# Patient Record
Sex: Female | Born: 1965 | Race: Asian | Hispanic: No | Marital: Married | Smoking: Never smoker
Health system: Southern US, Community
[De-identification: ages and names within clinical notes are randomized; demographics above are authoritative.]

## PROBLEM LIST (undated history)

## (undated) DIAGNOSIS — K635 Polyp of colon: Secondary | ICD-10-CM

## (undated) DIAGNOSIS — T7840XA Allergy, unspecified, initial encounter: Secondary | ICD-10-CM

## (undated) DIAGNOSIS — E559 Vitamin D deficiency, unspecified: Secondary | ICD-10-CM

## (undated) DIAGNOSIS — K219 Gastro-esophageal reflux disease without esophagitis: Secondary | ICD-10-CM

## (undated) HISTORY — DX: Allergy, unspecified, initial encounter: T78.40XA

## (undated) HISTORY — DX: Vitamin D deficiency, unspecified: E55.9

## (undated) HISTORY — DX: Polyp of colon: K63.5

## (undated) HISTORY — DX: Gastro-esophageal reflux disease without esophagitis: K21.9

## (undated) HISTORY — PX: EYE SURGERY: SHX253

## (undated) HISTORY — PX: HEMORRHOID BANDING: SHX5850

---

## 2011-03-01 ENCOUNTER — Other Ambulatory Visit: Payer: Self-pay | Admitting: Neurology

## 2011-03-01 DIAGNOSIS — R200 Anesthesia of skin: Secondary | ICD-10-CM

## 2011-03-08 ENCOUNTER — Other Ambulatory Visit (HOSPITAL_COMMUNITY): Payer: Self-pay

## 2011-10-27 ENCOUNTER — Ambulatory Visit (INDEPENDENT_AMBULATORY_CARE_PROVIDER_SITE_OTHER): Payer: BC Managed Care – PPO | Admitting: Family Medicine

## 2011-10-27 VITALS — BP 98/68 | HR 67 | Temp 98.0°F | Resp 16 | Ht 61.5 in | Wt 107.0 lb

## 2011-10-27 DIAGNOSIS — R591 Generalized enlarged lymph nodes: Secondary | ICD-10-CM

## 2011-10-27 DIAGNOSIS — K219 Gastro-esophageal reflux disease without esophagitis: Secondary | ICD-10-CM

## 2011-10-27 DIAGNOSIS — R599 Enlarged lymph nodes, unspecified: Secondary | ICD-10-CM

## 2011-10-27 MED ORDER — DOXYCYCLINE HYCLATE 100 MG PO TABS: 100.0000 mg | ORAL_TABLET | Freq: Two times a day (BID) | ORAL | Status: AC

## 2011-10-27 MED ORDER — OMEPRAZOLE 40 MG PO CPDR: 40.0000 mg | DELAYED_RELEASE_CAPSULE | Freq: Every day | ORAL | Status: DC

## 2011-10-27 NOTE — Progress Notes (Signed)
46 yo with swollen right axillary area.  Also has long h/o reflux with past endoscopy showing inflammation.  She still has esophageal burning  Objective:  NAD Mild fleshy swelling right axilla.  No breast nodules, no cervical or supraclav adenopathy. Normal abdominal exam  Assessment:  GERD and inflammatory axillary node  Plan: 1. Adenopathy  doxycycline (VIBRA-TABS) 100 MG tablet  2. GERD (gastroesophageal reflux disease)  omeprazole (PRILOSEC) 40 MG capsule   Follow up 2 weeks

## 2011-11-03 ENCOUNTER — Ambulatory Visit: Payer: Self-pay | Admitting: Family

## 2011-11-10 ENCOUNTER — Encounter: Payer: Self-pay | Admitting: Family Medicine

## 2011-11-10 ENCOUNTER — Ambulatory Visit (INDEPENDENT_AMBULATORY_CARE_PROVIDER_SITE_OTHER): Payer: BC Managed Care – PPO | Admitting: Family Medicine

## 2011-11-10 VITALS — BP 122/72 | HR 72 | Temp 97.6°F | Resp 16 | Ht 61.5 in | Wt 106.0 lb

## 2011-11-10 DIAGNOSIS — R599 Enlarged lymph nodes, unspecified: Secondary | ICD-10-CM

## 2011-11-10 DIAGNOSIS — R590 Localized enlarged lymph nodes: Secondary | ICD-10-CM

## 2011-11-10 DIAGNOSIS — N6009 Solitary cyst of unspecified breast: Secondary | ICD-10-CM

## 2011-11-10 NOTE — Progress Notes (Signed)
46 yo with persistent right axillary adenopathy.  She took the doxycycline and thinks the gland is still swollen. Also, she has noted a left medial breast cyst.  This she has had in the past and a scan showed only benign findings.  Nevertheless she is concerned with the increase size of the lump now.  Objective:  Right axilla shows mild swelling which I think is just an intradermal cyst The left breast has a 1 cm nontender subcutaneous or breast nodule  Assessment:  I am referring patient for mammogram and surgical consultation to see if further biopsy is warranted: 1. Breast cyst  MM Digital Diagnostic Bilat  2. Axillary adenopathy  Ambulatory referral to General Surgery

## 2011-11-25 ENCOUNTER — Encounter (INDEPENDENT_AMBULATORY_CARE_PROVIDER_SITE_OTHER): Payer: Self-pay | Admitting: General Surgery

## 2011-11-25 ENCOUNTER — Ambulatory Visit (INDEPENDENT_AMBULATORY_CARE_PROVIDER_SITE_OTHER): Payer: BC Managed Care – PPO | Admitting: General Surgery

## 2011-11-25 ENCOUNTER — Telehealth (INDEPENDENT_AMBULATORY_CARE_PROVIDER_SITE_OTHER): Payer: Self-pay

## 2011-11-25 VITALS — BP 102/58 | HR 68 | Temp 98.0°F | Resp 16 | Ht 61.0 in | Wt 109.2 lb

## 2011-11-25 DIAGNOSIS — Q838 Other congenital malformations of breast: Secondary | ICD-10-CM

## 2011-11-25 NOTE — Telephone Encounter (Signed)
Fax medical record request to [facility] @ [phone] re: MGM, [place]

## 2011-11-25 NOTE — Progress Notes (Signed)
Subjective:   lump under her right arm  Patient ID: Diana Taylor, female   DOB: [birth year], 46 y.o.   MRN: [id number]  HPI Patient is a 46 year old female referred by Dr. Milus Glazier for a possible right axillary mass or adenopathy. The patient states that she noted an area of swelling under her right arm about 2 months ago. It was initially tender. It has remained the same size but now is not at all tender. She has no other areas of swelling in her neck or axilla or groins. No night sweats or fevers or other constitutional symptoms. She otherwise feels well.  She has had mammogram and ultrasound performed at [facility] and we have a verbal report of negative results but we'll confirm with reports.  History reviewed. No pertinent past medical history. History reviewed. No pertinent past surgical history. Current Outpatient Prescriptions  Medication Sig Dispense Refill  . omeprazole (PRILOSEC) 40 MG capsule Take 1 capsule (40 mg total) by mouth daily.  30 capsule  3   No Known Allergies   Review of Systems  Constitutional: Negative for fever, chills, diaphoresis, fatigue and unexpected weight change.  Respiratory: Negative for cough and shortness of breath.        Objective:   Physical Exam BP 102/58  Pulse 68  Temp 98 F (36.7 C) (Temporal)  Resp 16  Ht 5\' 1"  (1.549 m)  Wt 109 lb 3.2 oz (49.533 kg)  BMI 20.63 kg/m2  LMP 11/03/2011 General: Thin Asian female in no distress Skin: No rash or infection HEENT: No palpable masses or thyromegaly Breasts: No palpable abnormalities in either breast. No skin changes or nipple inversion. Lymph nodes/extremities: I cannot feel any enlarged lymph nodes in her neck, either axilla or groins. Peri-in the right axilla, actually toward the right side of the axilla is an approximately 2-1/2 cm soft smooth rounded area of soft tissue swelling consistent with ectopic breast tissue. 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 I do not feel adenopathy or anything that is worrisome for neoplasm.    Plan:     I discussed the diagnosis with the patient and she is reassured. I will review her breast imaging when we obtain this. If this area remains asymptomatic and does not enlarge I would not recommend any surgical treatment. She is given instructions to return for a repeat examination if the area again become symptomatic or enlarges or worries were in any way.

## 2011-11-25 NOTE — Patient Instructions (Signed)
This appears to be a small area of "ectopic" or extra breast tissue under your arm. It is not dangerous and does not need to be removed.  If this area enlarges or there are other changes that concern you call for a repeat examination

## 2011-12-07 ENCOUNTER — Encounter: Payer: Self-pay | Admitting: Family Medicine

## 2012-03-17 ENCOUNTER — Ambulatory Visit (INDEPENDENT_AMBULATORY_CARE_PROVIDER_SITE_OTHER): Payer: BC Managed Care – PPO | Admitting: Family Medicine

## 2012-03-17 VITALS — BP 115/74 | HR 74 | Temp 98.1°F | Resp 16 | Ht 61.0 in | Wt 110.0 lb

## 2012-03-17 DIAGNOSIS — R599 Enlarged lymph nodes, unspecified: Secondary | ICD-10-CM

## 2012-03-17 DIAGNOSIS — E041 Nontoxic single thyroid nodule: Secondary | ICD-10-CM

## 2012-03-17 DIAGNOSIS — E0789 Other specified disorders of thyroid: Secondary | ICD-10-CM

## 2012-03-17 DIAGNOSIS — R591 Generalized enlarged lymph nodes: Secondary | ICD-10-CM

## 2012-03-17 DIAGNOSIS — Z0000 Encounter for general adult medical examination without abnormal findings: Secondary | ICD-10-CM

## 2012-03-17 LAB — POCT CBC
Granulocyte percent: 59.8 %G (ref 37–80)
HCT, POC: 41.9 % (ref 37.7–47.9)
Hemoglobin: 13.1 g/dL (ref 12.2–16.2)
Lymph, poc: 1.9 (ref 0.6–3.4)
MCH, POC: 29.7 pg (ref 27–31.2)
MCHC: 31.3 g/dL — AB (ref 31.8–35.4)
MCV: 95.1 fL (ref 80–97)
MID (cbc): 0.5 (ref 0–0.9)
MPV: 9.2 fL (ref 0–99.8)
POC Granulocyte: 3.6 (ref 2–6.9)
POC LYMPH PERCENT: 31.4 %L (ref 10–50)
POC MID %: 8.8 % (ref 0–12)
Platelet Count, POC: 246 10*3/uL (ref 142–424)
RBC: 4.41 M/uL (ref 4.04–5.48)
RDW, POC: 13 %
WBC: 6 10*3/uL (ref 4.6–10.2)

## 2012-03-17 LAB — IFOBT (OCCULT BLOOD): IFOBT: POSITIVE

## 2012-03-17 NOTE — Progress Notes (Signed)
 [facility]:  Office Visit  Chief Complaint:  Chief Complaint  Patient presents with  . Annual Exam    HPI: Diana Taylor is a 47 y.o. female who complains of Annual exam: Always feels full. She had an EGD done in [facility] 2010 and was told she had some esophagus issue sounds like GERD.  LMP- 03/13/2012 Last pap smear-? Last mammogram-last done in October 2013. Normal.   Has lumps on neck, was having lumps on right axilla and was seen by Dr. Milus Glazier and was told that there was nothing to worry about after seen by general surgery--see note in EPIC.  Denies any immunocompromise, denies TB, HIV, unintentional weight loss.   History reviewed. No pertinent past medical history. History reviewed. No pertinent past surgical history. History   Social History  . Marital Status: Married    Spouse Name: N/A    Number of Children: N/A  . Years of Education: N/A   Social History Main Topics  . Smoking status: Never Smoker   . Smokeless tobacco: None  . Alcohol Use: None  . Drug Use: None  . Sexually Active: None   Other Topics Concern  . None   Social History Narrative  . None   Family History  Problem Relation Age of Onset  . Stroke Mother   . COPD Father    No Known Allergies Prior to Admission medications   Medication Sig Start Date End Date Taking? Authorizing Provider  omeprazole (PRILOSEC) 40 MG capsule Take 1 capsule (40 mg total) by mouth daily. 10/27/11 10/26/12  Elvina Sidle, MD     ROS: The patient denies fevers, chills, night sweats, unintentional weight loss, chest pain, palpitations, wheezing, dyspnea on exertion, nausea, vomiting, abdominal pain, dysuria, hematuria, melena, numbness, weakness, or tingling.   All other systems have been reviewed and were otherwise negative with the exception of those mentioned in the HPI and as above.    PHYSICAL EXAM: Filed Vitals:   03/17/12 1538  BP: 115/74  Pulse: 74  Temp: 98.1 F (36.7 C)   Resp: 16   Filed Vitals:   03/17/12 1538  Height: 5\' 1"  (1.549 m)  Weight: 110 lb (49.896 kg)   Body mass index is 20.78 kg/(m^2).  General: Alert, no acute distress HEENT:  Normocephalic, atraumatic, oropharynx patent.  Cardiovascular:  Regular rate and rhythm, no rubs murmurs or gallops.  No Carotid bruits, radial pulse intact. No pedal edema.  Respiratory: Clear to auscultation bilaterally.  No wheezes, rales, or rhonchi.  No cyanosis, no use of accessory musculature GI: No organomegaly, abdomen is soft and non-tender, positive bowel sounds.  No masses. Skin: No rashes. Neurologic: Facial musculature symmetric. Psychiatric: Patient is appropriate throughout our interaction. Lymphatic: + cervical lymphadenopathy, multiple small < 5 mm LAD along anterior and posterior chains. She does appear to have some thyroid fullness on left side. NO subclavicular , pre-post auricular LAD, no inguinal LAD Musculoskeletal: Gait intact. Breast exam and GU exam nl   LABS: Results for orders placed in visit on 03/17/12  POCT CBC      Component Value Range   WBC 6.0  4.6 - 10.2 K/uL   Lymph, poc 1.9  0.6 - 3.4   POC LYMPH PERCENT 31.4  10 - 50 %L   MID (cbc) 0.5  0 - 0.9   POC MID % 8.8  0 - 12 %M   POC Granulocyte 3.6  2 - 6.9   Granulocyte percent 59.8  37 - 80 %G   RBC 4.41  4.04 - 5.48 M/uL   Hemoglobin 13.1  12.2 - 16.2 g/dL   HCT, POC 32.4  40.1 - 47.9 %   MCV 95.1  80 - 97 fL   MCH, POC 29.7  27 - 31.2 pg   MCHC 31.3 (*) 31.8 - 35.4 g/dL   RDW, POC 02.7     Platelet Count, POC 246  142 - 424 K/uL   MPV 9.2  0 - 99.8 fL  IFOBT (OCCULT BLOOD)      Component Value Range   IFOBT Positive       EKG/XRAY:   Primary read interpreted by Dr. Conley Rolls at [facility].   ASSESSMENT/PLAN: Encounter Diagnoses  Name Primary?  . Annual physical exam Yes  . LAD (lymphadenopathy)   . Thyroid fullness    Labs pending Patient had mamogram in October 2013. Was nl. Need to get it.  IFOBT is +  because patient on her period. IF LAD is till present in 4 weeks then need to return for further workup F/u prn    ,  PHUONG, DO 03/17/2012 4:53 PM

## 2012-03-18 LAB — LDL CHOLESTEROL, DIRECT: Direct LDL: 52 mg/dL

## 2012-03-18 LAB — COMPREHENSIVE METABOLIC PANEL WITH GFR
ALT: 9 U/L (ref 0–35)
BUN: 10 mg/dL (ref 6–23)
CO2: 29 meq/L (ref 19–32)
Calcium: 8.8 mg/dL (ref 8.4–10.5)
Creat: 0.55 mg/dL (ref 0.50–1.10)
Total Bilirubin: 0.3 mg/dL (ref 0.3–1.2)

## 2012-03-18 LAB — COMPREHENSIVE METABOLIC PANEL
AST: 16 U/L (ref 0–37)
Albumin: 4.5 g/dL (ref 3.5–5.2)
Alkaline Phosphatase: 51 U/L (ref 39–117)
Chloride: 104 mEq/L (ref 96–112)
Glucose, Bld: 98 mg/dL (ref 70–99)
Potassium: 4.1 mEq/L (ref 3.5–5.3)
Sodium: 138 mEq/L (ref 135–145)
Total Protein: 6.9 g/dL (ref 6.0–8.3)

## 2012-03-18 LAB — TSH: TSH: 0.674 u[IU]/mL (ref 0.350–4.500)

## 2012-03-18 LAB — HIV ANTIBODY (ROUTINE TESTING W REFLEX): HIV: NONREACTIVE

## 2012-03-20 LAB — PAP IG W/ RFLX HPV ASCU

## 2012-03-22 ENCOUNTER — Telehealth: Payer: Self-pay | Admitting: Family Medicine

## 2012-03-22 NOTE — Telephone Encounter (Signed)
Called [facility] and they will be faxing over copy of patients mammogram

## 2012-04-08 ENCOUNTER — Other Ambulatory Visit: Payer: Self-pay

## 2012-05-02 ENCOUNTER — Ambulatory Visit (INDEPENDENT_AMBULATORY_CARE_PROVIDER_SITE_OTHER): Payer: BC Managed Care – PPO | Admitting: Family Medicine

## 2012-05-02 VITALS — BP 122/70 | HR 84 | Temp 97.8°F | Resp 16 | Ht 61.5 in | Wt 111.6 lb

## 2012-05-02 DIAGNOSIS — K921 Melena: Secondary | ICD-10-CM

## 2012-05-02 DIAGNOSIS — R21 Rash and other nonspecific skin eruption: Secondary | ICD-10-CM

## 2012-05-02 DIAGNOSIS — R599 Enlarged lymph nodes, unspecified: Secondary | ICD-10-CM

## 2012-05-02 DIAGNOSIS — R591 Generalized enlarged lymph nodes: Secondary | ICD-10-CM

## 2012-05-02 LAB — POCT CBC
Granulocyte percent: 64.6 % (ref 37–80)
HCT, POC: 42.2 % (ref 37.7–47.9)
Hemoglobin: 13.1 g/dL (ref 12.2–16.2)
Lymph, poc: 1.7 (ref 0.6–3.4)
MCH, POC: 29.5 pg (ref 27–31.2)
MCHC: 31 g/dL — AB (ref 31.8–35.4)
MCV: 95 fL (ref 80–97)
MID (cbc): 0.6 (ref 0–0.9)
MPV: 9.8 fL (ref 0–99.8)
POC Granulocyte: 4.1 (ref 2–6.9)
POC LYMPH PERCENT: 26.4 %L (ref 10–50)
POC MID %: 9 %M (ref 0–12)
Platelet Count, POC: 247 10*3/uL (ref 142–424)
RBC: 4.44 M/uL (ref 4.04–5.48)
RDW, POC: 12.9 %
WBC: 6.3 10*3/uL (ref 4.6–10.2)

## 2012-05-02 LAB — IFOBT (OCCULT BLOOD): IFOBT: NEGATIVE

## 2012-05-02 MED ORDER — TRIAMCINOLONE ACETONIDE 0.1 % EX CREA: TOPICAL_CREAM | Freq: Two times a day (BID) | CUTANEOUS | Status: DC

## 2012-05-02 NOTE — Progress Notes (Signed)
[facility]:  Office Visit  Chief Complaint:  Chief Complaint  Patient presents with  . Follow-up    nodes swollen in neck and arm pits- grown and more have developed  . Rash    HPI: Diana Taylor is a 47 y.o. female who complains of : 1. She has had LAD and also neck swelling, and also has had right axillary LAD which she is worried  About, afraid that it might be caner. We had talked about it on her last visit on 03/17/12. The last time I had asked her to monitor and if things are not improved then we would get an [place] since she is afraid of cancer. She state they have gotten larger and also more tender. She denies any unintentional weightloss, fevers, chills, hemoptysis, cough, night sweats. She does not have any exposure to TB that she knows of. CBC last OV was WNL .  2. Her IFOBT was + on annual visit but she was on her menses. I will echeck her IFOBT today. She is not read 3. Rash on skin-eczema. Has tried something over OTC.   No past medical history on file. No past surgical history on file. History   Social History  . Marital Status: Married    Spouse Name: N/A    Number of Children: N/A  . Years of Education: N/A   Social History Main Topics  . Smoking status: Never Smoker   . Smokeless tobacco: None  . Alcohol Use: None  . Drug Use: None  . Sexually Active: None   Other Topics Concern  . None   Social History Narrative  . None   Family History  Problem Relation Age of Onset  . Stroke Mother   . COPD Father    No Known Allergies Prior to Admission medications   Medication Sig Start Date End Date Taking? Authorizing Berdell Hostetler  omeprazole (PRILOSEC) 40 MG capsule Take 1 capsule (40 mg total) by mouth daily. 10/27/11 10/26/12 Yes Elvina Sidle, MD     ROS: The patient denies fevers, chills, night sweats, unintentional weight loss, chest pain, palpitations, wheezing, dyspnea on exertion, nausea, vomiting, abdominal pain, dysuria, hematuria,  melena, numbness, weakness, or tingling.   All other systems have been reviewed and were otherwise negative with the exception of those mentioned in the HPI and as above.    PHYSICAL EXAM: Filed Vitals:   05/02/12 1353  BP: 122/70  Pulse: 84  Temp: 97.8 F (36.6 C)  Resp: 16   Filed Vitals:   05/02/12 1353  Height: 5' 1.5" (1.562 m)  Weight: 111 lb 9.6 oz (50.621 kg)   Body mass index is 20.75 kg/(m^2).  General: Alert, no acute distress HEENT:  Normocephalic, atraumatic, oropharynx patent. No goiters or thyroidmegaly Cardiovascular:  Regular rate and rhythm, no rubs murmurs or gallops.  No Carotid bruits, radial pulse intact. No pedal edema.  Respiratory: Clear to auscultation bilaterally.  No wheezes, rales, or rhonchi.  No cyanosis, no use of accessory musculature GI: No organomegaly, abdomen is soft and non-tender, positive bowel sounds.  No masses. Skin: + eczema . Neurologic: Facial musculature symmetric. Psychiatric: Patient is appropriate throughout our interaction. Lymphatic: + right sided anterior cervical lymphadenopathy along jugular vein, jugular vein very prominent. Patient also has tender LAD/swelling/slightlty enlarged tissue mass on right axilla, she is thin so her acillary LAD is bilaterally is prominent but she states she is tender. Musculoskeletal: Gait intact.   LABS: Results for orders placed in  visit on 05/02/12  POCT CBC      Result Value Range   WBC 6.3  4.6 - 10.2 K/uL   Lymph, poc 1.7  0.6 - 3.4   POC LYMPH PERCENT 26.4  10 - 50 %L   MID (cbc) 0.6  0 - 0.9   POC MID % 9.0  0 - 12 %M   POC Granulocyte 4.1  2 - 6.9   Granulocyte percent 64.6  37 - 80 %G   RBC 4.44  4.04 - 5.48 M/uL   Hemoglobin 13.1  12.2 - 16.2 g/dL   HCT, POC 45.4  09.8 - 47.9 %   MCV 95.0  80 - 97 fL   MCH, POC 29.5  27 - 31.2 pg   MCHC 31.0 (*) 31.8 - 35.4 g/dL   RDW, POC 11.9     Platelet Count, POC 247  142 - 424 K/uL   MPV 9.8  0 - 99.8 fL  IFOBT (OCCULT BLOOD)       Result Value Range   IFOBT Negative       EKG/XRAY:   Primary read interpreted by Dr. Conley Rolls at [facility].   ASSESSMENT/PLAN: Encounter Diagnoses  Name Primary?  Marland Kitchen LAD (lymphadenopathy) Yes  . Blood in stool   . Rash and nonspecific skin eruption     Rx Triamcinolone Neg IFOBT Will get [place] of LAD in  Right axilla and also on the right side of her neck. I do not suspect it is anything mor than a variant of normal LAD/axillary tissue but the patient is very anxious about this. She has been complaing about this since 11/2011, was evaluated by General Surgery for it. At that time they said they would not do anything since she was asymptomatic but now she has more pain. She states the nodes have not gotten any smaller since I last saw her and they are more tender. I am not sure how often she is palpating them. F/u after she gets [place] completed.    Hamilton Capri PHUONG, DO 05/02/2012 3:48 PM

## 2012-05-05 ENCOUNTER — Ambulatory Visit
Admission: RE | Admit: 2012-05-05 | Discharge: 2012-05-05 | Disposition: A | Discharge status: Home / Self Care | Payer: BC Managed Care – PPO | Source: Ambulatory Visit | Attending: Family Medicine | Admitting: Family Medicine

## 2012-05-05 DIAGNOSIS — R591 Generalized enlarged lymph nodes: Secondary | ICD-10-CM

## 2012-07-18 ENCOUNTER — Encounter (INDEPENDENT_AMBULATORY_CARE_PROVIDER_SITE_OTHER): Payer: Self-pay

## 2012-10-26 ENCOUNTER — Ambulatory Visit (INDEPENDENT_AMBULATORY_CARE_PROVIDER_SITE_OTHER): Payer: BC Managed Care – PPO | Admitting: Family Medicine

## 2012-10-26 VITALS — BP 106/66 | HR 74 | Temp 98.8°F | Resp 18 | Ht 61.0 in | Wt 106.0 lb

## 2012-10-26 DIAGNOSIS — L259 Unspecified contact dermatitis, unspecified cause: Secondary | ICD-10-CM

## 2012-10-26 DIAGNOSIS — R590 Localized enlarged lymph nodes: Secondary | ICD-10-CM

## 2012-10-26 DIAGNOSIS — L309 Dermatitis, unspecified: Secondary | ICD-10-CM

## 2012-10-26 DIAGNOSIS — R599 Enlarged lymph nodes, unspecified: Secondary | ICD-10-CM

## 2012-10-26 LAB — POCT CBC
Granulocyte percent: 65.4 %G (ref 37–80)
HCT, POC: 40.2 % (ref 37.7–47.9)
Hemoglobin: 12.6 g/dL (ref 12.2–16.2)
Lymph, poc: 1.6 (ref 0.6–3.4)
MCHC: 31.3 g/dL — AB (ref 31.8–35.4)
MPV: 9.2 fL (ref 0–99.8)
POC Granulocyte: 4.1 (ref 2–6.9)
POC MID %: 8 %M (ref 0–12)
RDW, POC: 12.9 %

## 2012-10-26 LAB — POCT WET PREP WITH KOH
KOH Prep POC: NEGATIVE
Trichomonas, UA: NEGATIVE

## 2012-10-26 LAB — POCT SEDIMENTATION RATE: POCT SED RATE: 10 mm/hr (ref 0–22)

## 2012-10-26 LAB — IFOBT (OCCULT BLOOD): IFOBT: NEGATIVE

## 2012-10-26 NOTE — Progress Notes (Signed)
[address]   [place], [place]  [address]   [phone]  Subjective:    Patient ID: Diana Taylor, female    DOB: 1965-10-31, 47 y.o.   MRN: [id number]  HPI This 47 y.o. female presents for evaluation of enlarging L inguinal LAD.  Evaluated by Dr. Conley Rolls at [facility] in 04/2012 for axillary, cervical, inguinal LAD.  Labs normal.  OC light negative. Pap smear normal 02/2012.  Sp ultrasound of neck normal.  No vaginal discharge, lesions, irritation, pain.  No skin rash of lower extremities; +itching of L side of abdomen chronic issue.  Prescribed cream by Dr. Conley Rolls in past year; using sporadically for itching. Uses Tide.  Review of Systems  Constitutional: Negative for fever, chills, diaphoresis, activity change, appetite change and fatigue.  Gastrointestinal: Negative for nausea, vomiting, abdominal pain, diarrhea, constipation, blood in stool, abdominal distention and anal bleeding.  Genitourinary: Negative for vaginal bleeding, vaginal discharge, genital sores, vaginal pain and pelvic pain.  Skin: Positive for rash.   Past Medical History  Diagnosis Date  . Allergy    History reviewed. No pertinent past surgical history.  No Known Allergies History   Social History  . Marital Status: Married    Spouse Name: N/A    Number of Children: N/A  . Years of Education: N/A   Occupational History  . Not on file.   Social History Main Topics  . Smoking status: Never Smoker   . Smokeless tobacco: Not on file  . Alcohol Use: Not on file  . Drug Use: Not on file  . Sexual Activity: Not on file   Other Topics Concern  . Not on file   Social History Narrative  . No narrative on file   Family History  Problem Relation Age of Onset  . Stroke Mother   . COPD Father       Objective:   Physical Exam  Nursing note and vitals reviewed. Constitutional: She appears well-developed and well-nourished. No distress.  HENT:  Head: Normocephalic and atraumatic.  Neck: Normal range of motion. Neck  supple. No thyromegaly present.  Cardiovascular: Normal rate, regular rhythm and normal heart sounds.   No murmur heard. Pulmonary/Chest: Effort normal and breath sounds normal.  Abdominal: Soft. Bowel sounds are normal. She exhibits no distension. There is no tenderness. There is no rebound and no guarding.  Genitourinary: Rectum normal, vagina normal and uterus normal. There is no rash, tenderness or lesion on the right labia. There is no rash, tenderness or lesion on the left labia. Cervix exhibits no motion tenderness, no discharge and no friability. Right adnexum displays no mass, no tenderness and no fullness. Left adnexum displays no mass, no tenderness and no fullness. No vaginal discharge found.  Lymphadenopathy:    She has no cervical adenopathy.       Right: No inguinal adenopathy present.       Left: Inguinal adenopathy present.  Palpable L inguinal lymph node approximately 64mm-10mm diameter; mildly tender.  Skin: Skin is warm and dry. Rash noted. She is not diaphoretic.  +diffuse erythematous rash L aspect of abdomen diffusely; no vesicles, pustules; non-tender; no induration.  Psychiatric: She has a normal mood and affect. Her behavior is normal. Judgment and thought content normal.      Results for orders placed in visit on 10/26/12  POCT CBC      Result Value Range   WBC 6.2  4.6 - 10.2 K/uL   Lymph, poc 1.6  0.6 - 3.4  POC LYMPH PERCENT 26.6  10 - 50 %L   MID (cbc) 0.5  0 - 0.9   POC MID % 8.0  0 - 12 %M   POC Granulocyte 4.1  2 - 6.9   Granulocyte percent 65.4  37 - 80 %G   RBC 4.15  4.04 - 5.48 M/uL   Hemoglobin 12.6  12.2 - 16.2 g/dL   HCT, POC 16.1  09.6 - 47.9 %   MCV 96.9  80 - 97 fL   MCH, POC 30.4  27 - 31.2 pg   MCHC 31.3 (*) 31.8 - 35.4 g/dL   RDW, POC 04.5     Platelet Count, POC 236  142 - 424 K/uL   MPV 9.2  0 - 99.8 fL  IFOBT (OCCULT BLOOD)      Result Value Range   IFOBT Negative    POCT WET PREP WITH KOH      Result Value Range   Trichomonas,  UA Negative     Clue Cells Wet Prep HPF POC 1-2     Epithelial Wet Prep HPF POC 3-10     Yeast Wet Prep HPF POC neg     Bacteria Wet Prep HPF POC 4+     RBC Wet Prep HPF POC 1-4     WBC Wet Prep HPF POC 0-4     KOH Prep POC Negative      Assessment & Plan:  Dermatitis - Plan: POCT CBC, IFOBT POC (occult bld, rslt in office), POCT SEDIMENTATION RATE, POCT Wet Prep with KOH, GC/Chlamydia Probe Amp  Inguinal lymphadenopathy - Plan: POCT CBC, IFOBT POC (occult bld, rslt in office), POCT SEDIMENTATION RATE, POCT Wet Prep with KOH, GC/Chlamydia Probe Amp, [place] Extrem Low Left Ltd  1. L inguinal LAD:  Persistent; upper limits of normal in size.  Refer for inguinal u/s to evaluate.  No genital pathology present; recent Pap smear normal; hemoccult negative.  No lower extremity skin lesions or abnormalities.   2.  Dermatitis L abdomen:  Persistent; chronic issue; recommend continuing Zyrtec or Claritin daily; continue topical cream as previously prescribed.  Recommend soaps and detergents for sensitive skin.   No orders of the defined types were placed in this encounter.

## 2012-10-27 LAB — GC/CHLAMYDIA PROBE AMP
CT Probe RNA: NEGATIVE
GC Probe RNA: NEGATIVE

## 2012-12-28 ENCOUNTER — Other Ambulatory Visit: Payer: Self-pay

## 2013-05-09 ENCOUNTER — Encounter: Payer: Self-pay | Admitting: Family Medicine

## 2013-09-13 ENCOUNTER — Ambulatory Visit (INDEPENDENT_AMBULATORY_CARE_PROVIDER_SITE_OTHER): Payer: BC Managed Care – PPO | Admitting: Family Medicine

## 2013-09-13 VITALS — BP 102/68 | HR 74 | Temp 98.8°F | Resp 18 | Ht 61.5 in | Wt 110.0 lb

## 2013-09-13 DIAGNOSIS — Z13220 Encounter for screening for lipoid disorders: Secondary | ICD-10-CM

## 2013-09-13 DIAGNOSIS — Z124 Encounter for screening for malignant neoplasm of cervix: Secondary | ICD-10-CM

## 2013-09-13 DIAGNOSIS — Z23 Encounter for immunization: Secondary | ICD-10-CM

## 2013-09-13 DIAGNOSIS — N926 Irregular menstruation, unspecified: Secondary | ICD-10-CM

## 2013-09-13 DIAGNOSIS — Z1239 Encounter for other screening for malignant neoplasm of breast: Secondary | ICD-10-CM

## 2013-09-13 DIAGNOSIS — Z0000 Encounter for general adult medical examination without abnormal findings: Secondary | ICD-10-CM

## 2013-09-13 DIAGNOSIS — Z131 Encounter for screening for diabetes mellitus: Secondary | ICD-10-CM

## 2013-09-13 LAB — POCT URINALYSIS DIPSTICK
Bilirubin, UA: NEGATIVE
GLUCOSE UA: NEGATIVE
KETONES UA: NEGATIVE
LEUKOCYTES UA: NEGATIVE
Nitrite, UA: NEGATIVE
PROTEIN UA: NEGATIVE
SPEC GRAV UA: 1.02
UROBILINOGEN UA: 0.2
pH, UA: 6.5

## 2013-09-13 LAB — POCT CBC
GRANULOCYTE PERCENT: 66.6 % (ref 37–80)
HCT, POC: 40.5 % (ref 37.7–47.9)
Hemoglobin: 13.6 g/dL (ref 12.2–16.2)
Lymph, poc: 1.5 (ref 0.6–3.4)
MCH, POC: 31.2 pg (ref 27–31.2)
MCHC: 33.5 g/dL (ref 31.8–35.4)
MCV: 93.3 fL (ref 80–97)
MID (CBC): 0.6 (ref 0–0.9)
MPV: 8.6 fL (ref 0–99.8)
PLATELET COUNT, POC: 217 10*3/uL (ref 142–424)
POC GRANULOCYTE: 4.2 (ref 2–6.9)
POC LYMPH %: 24.4 % (ref 10–50)
POC MID %: 9 % (ref 0–12)
RBC: 4.34 M/uL (ref 4.04–5.48)
RDW, POC: 13 %
WBC: 6.3 10*3/uL (ref 4.6–10.2)

## 2013-09-13 LAB — POCT UA - MICROSCOPIC ONLY
CRYSTALS, UR, HPF, POC: NEGATIVE
Casts, Ur, LPF, POC: NEGATIVE
Mucus, UA: NEGATIVE
Yeast, UA: NEGATIVE

## 2013-09-13 LAB — POCT URINE PREGNANCY: Preg Test, Ur: NEGATIVE

## 2013-09-13 NOTE — Progress Notes (Signed)
Subjective:    Patient ID: Diana Taylor, female    DOB: [birth year], 47 y.o.   MRN: [id number]  09/13/2013  Menstrual Problem   HPI This 48 y.o. female presents for evaluation of menstrual irregularities.  Two periods this month and two periods last month.  Previously, having monthly.  Bleeds every 23 days; will bleed 7 days;light; no cramping.  Bloating with menses.   Menses 4/15-4/19 5/9-5/15 6/6-6/10 6/29-7/3 7/11-7/18  Last pap smear last year 03/17/2012 WNL.   Mammogram last year 11/10/11. Last Tetanus shot unknown. Flu vaccine never Colonoscopy never. Eye exam yearly; +glasses Dental exam:  In [place] every two years.    Review of Systems  Constitutional: Negative for fever, chills, diaphoresis, activity change, appetite change, fatigue and unexpected weight change.  HENT: Negative for congestion, dental problem, drooling, ear discharge, ear pain, facial swelling, hearing loss, mouth sores, nosebleeds, postnasal drip, rhinorrhea, sinus pressure, sneezing, sore throat, tinnitus, trouble swallowing and voice change.   Eyes: Negative for photophobia, pain, discharge, redness, itching and visual disturbance.  Respiratory: Negative for apnea, cough, choking, chest tightness, shortness of breath, wheezing and stridor.   Cardiovascular: Negative for chest pain, palpitations and leg swelling.  Gastrointestinal: Negative for nausea, vomiting, abdominal pain, diarrhea, constipation, blood in stool, abdominal distention, anal bleeding and rectal pain.  Endocrine: Negative for cold intolerance, heat intolerance, polydipsia, polyphagia and polyuria.  Genitourinary: Positive for menstrual problem. Negative for dysuria, urgency, frequency, hematuria, flank pain, decreased urine volume, vaginal bleeding, vaginal discharge, enuresis, difficulty urinating, genital sores, vaginal pain, pelvic pain and dyspareunia.  Musculoskeletal: Negative for arthralgias, back pain, gait problem, joint  swelling, myalgias, neck pain and neck stiffness.  Skin: Negative for color change, pallor, rash and wound.  Allergic/Immunologic: Negative for environmental allergies, food allergies and immunocompromised state.  Neurological: Negative for dizziness, tremors, seizures, syncope, facial asymmetry, speech difficulty, weakness, light-headedness, numbness and headaches.  Hematological: Negative for adenopathy. Does not bruise/bleed easily.  Psychiatric/Behavioral: Negative for suicidal ideas, hallucinations, behavioral problems, confusion, sleep disturbance, self-injury, dysphoric mood, decreased concentration and agitation. The patient is not nervous/anxious and is not hyperactive.     Past Medical History  Diagnosis Date  . Allergy    History reviewed. No pertinent past surgical history. No Known Allergies Current Outpatient Prescriptions  Medication Sig Dispense Refill  . omeprazole (PRILOSEC) 40 MG capsule Take 1 capsule (40 mg total) by mouth daily.  30 capsule  3  . triamcinolone cream (KENALOG) 0.1 % Apply topically 2 (two) times daily.  60 g  1   No current facility-administered medications for this visit.   History   Social History  . Marital Status: Married    Spouse Name: N/A    Number of Children: N/A  . Years of Education: N/A   Occupational History  . Not on file.   Social History Main Topics  . Smoking status: Never Smoker   . Smokeless tobacco: Not on file  . Alcohol Use: No  . Drug Use: No  . Sexual Activity: Yes    Birth Control/ Protection: None   Other Topics Concern  . Not on file   Social History Narrative   Marital status: married x 25 years; from [place]; moved to [place] 2001      Children: 1 child (25); no grandchildren      Lives: with husband.      Employment: unemployed      Tobacco; none      Alcohol: none  Drugs: none      Exercise:  Walking daily.   Family History  Problem Relation Age of Onset  . Stroke Mother [id number]    CVA  . COPD  Father        Objective:    BP 102/68  Pulse 74  Temp(Src) 98.8 F (37.1 C) (Oral)  Resp 18  Ht 5' 1.5" (1.562 m)  Wt 110 lb (49.896 kg)  BMI 20.45 kg/m2  SpO2 99%  LMP 09/01/2013 Physical Exam  Nursing note and vitals reviewed. Constitutional: She is oriented to person, place, and time. She appears well-developed and well-nourished. No distress.  HENT:  Head: Normocephalic and atraumatic.  Right Ear: External ear normal.  Left Ear: External ear normal.  Nose: Nose normal.  Mouth/Throat: Oropharynx is clear and moist.  Eyes: Conjunctivae and EOM are normal. Pupils are equal, round, and reactive to light.  Neck: Normal range of motion and full passive range of motion without pain. Neck supple. No JVD present. Carotid bruit is not present. No thyromegaly present.  Cardiovascular: Normal rate, regular rhythm and normal heart sounds.  Exam reveals no gallop and no friction rub.   No murmur heard. Pulmonary/Chest: Effort normal and breath sounds normal. She has no wheezes. She has no rales.  Abdominal: Soft. Bowel sounds are normal. She exhibits no distension and no mass. There is no tenderness. There is no rebound and no guarding.  Musculoskeletal:       Right shoulder: Normal.       Left shoulder: Normal.       Cervical back: Normal.  Lymphadenopathy:    She has no cervical adenopathy.  Neurological: She is alert and oriented to person, place, and time. She has normal reflexes. No cranial nerve deficit. She exhibits normal muscle tone. Coordination normal.  Skin: Skin is warm and dry. No rash noted. She is not diaphoretic. No erythema. No pallor.  Psychiatric: She has a normal mood and affect. Her behavior is normal. Judgment and thought content normal.   Results for orders placed in visit on 09/13/13  POCT CBC      Result Value Ref Range   WBC 6.3  4.6 - 10.2 K/uL   Lymph, poc 1.5  0.6 - 3.4   POC LYMPH PERCENT 24.4  10 - 50 %L   MID (cbc) 0.6  0 - 0.9   POC MID % 9.0  0  - 12 %M   POC Granulocyte 4.2  2 - 6.9   Granulocyte percent 66.6  37 - 80 %G   RBC 4.34  4.04 - 5.48 M/uL   Hemoglobin 13.6  12.2 - 16.2 g/dL   HCT, POC 13.240.5  44.037.7 - 47.9 %   MCV 93.3  80 - 97 fL   MCH, POC 31.2  27 - 31.2 pg   MCHC 33.5  31.8 - 35.4 g/dL   RDW, POC 10.213.0     Platelet Count, POC 217  142 - 424 K/uL   MPV 8.6  0 - 99.8 fL  POCT UA - MICROSCOPIC ONLY      Result Value Ref Range   WBC, Ur, HPF, POC 1-2     RBC, urine, microscopic 2-4     Bacteria, U Microscopic trace     Mucus, UA neg     Epithelial cells, urine per micros 1-2     Crystals, Ur, HPF, POC neg     Casts, Ur, LPF, POC neg     Yeast, UA neg  POCT URINALYSIS DIPSTICK      Result Value Ref Range   Color, UA yellow     Clarity, UA clear     Glucose, UA neg     Bilirubin, UA neg     Ketones, UA neg     Spec Grav, UA 1.020     Blood, UA trace     pH, UA 6.5     Protein, UA neg     Urobilinogen, UA 0.2     Nitrite, UA neg     Leukocytes, UA Negative    POCT URINE PREGNANCY      Result Value Ref Range   Preg Test, Ur Negative         Assessment & Plan:   1. Routine general medical examination at a health care facility   2. Irregular menstrual bleeding   3. Screening for diabetes mellitus   4. Screening for cholesterol level   5. Breast cancer screening   6. Cervical cancer screening   7. Need for Tdap vaccination    1. Complete Physical Examination:  Anticipatory guidance provided. Pap smear obtained; refer for mammogram.  S/p TDAP 2.  Gynecological exam: pap smear obtained; refer for mammogram. 3.  Screening for DMII: obtain glucose, HgbA1c. 4.  Screening for cholesterol: obtain FLP 5. S/p TDAP 6.  Irregular menstrual bleeding: New.  Urine pregnancy negative; send pap smear; obtain CBC, TSH.  Refer to gynecology.  Likely consistent with perimenopausal state.  No orders of the defined types were placed in this encounter.    No Follow-up on file.    Nilda Simmer, M.D.  [facility] [address] [place], [place]  [address] [phone] phone [phone] fax

## 2013-09-14 LAB — COMPREHENSIVE METABOLIC PANEL
ALBUMIN: 4.4 g/dL (ref 3.5–5.2)
ALK PHOS: 42 U/L (ref 39–117)
ALT: 17 U/L (ref 0–35)
AST: 16 U/L (ref 0–37)
BILIRUBIN TOTAL: 0.3 mg/dL (ref 0.2–1.2)
BUN: 15 mg/dL (ref 6–23)
CO2: 28 meq/L (ref 19–32)
Calcium: 8.7 mg/dL (ref 8.4–10.5)
Chloride: 106 mEq/L (ref 96–112)
Creat: 0.63 mg/dL (ref 0.50–1.10)
GLUCOSE: 85 mg/dL (ref 70–99)
POTASSIUM: 4.2 meq/L (ref 3.5–5.3)
SODIUM: 139 meq/L (ref 135–145)
TOTAL PROTEIN: 7 g/dL (ref 6.0–8.3)

## 2013-09-14 LAB — PAP IG AND HPV HIGH-RISK: HPV DNA HIGH RISK: NOT DETECTED

## 2013-09-14 LAB — LIPID PANEL
CHOL/HDL RATIO: 2 ratio
Cholesterol: 135 mg/dL (ref 0–200)
HDL: 66 mg/dL (ref >39)
LDL Cholesterol: 53 mg/dL (ref 0–99)
Triglycerides: 81 mg/dL (ref <150)
VLDL: 16 mg/dL (ref 0–40)

## 2013-09-14 LAB — TSH: TSH: 0.683 u[IU]/mL (ref 0.350–4.500)

## 2013-09-14 LAB — HEMOGLOBIN A1C
Hgb A1c MFr Bld: 5.9 % — ABNORMAL HIGH (ref <5.7)
Mean Plasma Glucose: 123 mg/dL — ABNORMAL HIGH (ref <117)

## 2013-09-20 ENCOUNTER — Inpatient Hospital Stay (HOSPITAL_COMMUNITY)
Admission: AD | Admit: 2013-09-20 | Discharge: 2013-09-20 | Disposition: A | Discharge status: Home / Self Care | Payer: BC Managed Care – PPO | Source: Ambulatory Visit | Attending: Obstetrics and Gynecology | Admitting: Obstetrics and Gynecology

## 2013-09-20 DIAGNOSIS — R87619 Unspecified abnormal cytological findings in specimens from cervix uteri: Secondary | ICD-10-CM | POA: Insufficient documentation

## 2013-09-20 NOTE — ED Notes (Signed)
L.Barefoot,NP talked with pt and explained results and follow up plan for her PAP smear results. Pt agreeable

## 2015-08-19 ENCOUNTER — Inpatient Hospital Stay (HOSPITAL_COMMUNITY): Admit: 2015-08-19 | Payer: Self-pay

## 2015-12-15 LAB — VITAMIN D 25 HYDROXY (VIT D DEFICIENCY, FRACTURES): VIT D 25 HYDROXY: 67.51

## 2016-12-22 ENCOUNTER — Other Ambulatory Visit: Payer: Self-pay | Admitting: Internal Medicine

## 2016-12-22 ENCOUNTER — Other Ambulatory Visit (HOSPITAL_COMMUNITY): Payer: Self-pay | Admitting: Internal Medicine

## 2016-12-22 DIAGNOSIS — E049 Nontoxic goiter, unspecified: Secondary | ICD-10-CM | POA: Diagnosis not present

## 2016-12-22 DIAGNOSIS — Z1231 Encounter for screening mammogram for malignant neoplasm of breast: Secondary | ICD-10-CM

## 2016-12-22 DIAGNOSIS — R7303 Prediabetes: Secondary | ICD-10-CM | POA: Diagnosis not present

## 2016-12-22 DIAGNOSIS — Z0000 Encounter for general adult medical examination without abnormal findings: Secondary | ICD-10-CM | POA: Diagnosis not present

## 2016-12-22 LAB — LIPID PANEL
Cholesterol: 158 (ref 0–200)
HDL: 70 (ref 35–70)
LDL Cholesterol: 67
Triglycerides: 103 (ref 40–160)

## 2016-12-22 LAB — HEPATIC FUNCTION PANEL
ALK PHOS: 48 (ref 25–125)
ALT: 10 (ref 7–35)
AST: 16 (ref 13–35)

## 2016-12-22 LAB — BASIC METABOLIC PANEL
BUN: 13 (ref 4–21)
CREATININE: 0.8 (ref <=1.1)
GLUCOSE: 196
POTASSIUM: 4.7 (ref 3.4–5.3)
Sodium: 138 (ref 137–147)

## 2016-12-22 LAB — CBC AND DIFFERENTIAL
HEMATOCRIT: 40 (ref 36–46)
Hemoglobin: 12.5 (ref 12.0–16.0)
PLATELETS: 234 (ref 150–399)
WBC: 6

## 2017-01-05 DIAGNOSIS — E559 Vitamin D deficiency, unspecified: Secondary | ICD-10-CM | POA: Diagnosis not present

## 2017-01-05 DIAGNOSIS — R7303 Prediabetes: Secondary | ICD-10-CM | POA: Diagnosis not present

## 2017-01-05 DIAGNOSIS — E049 Nontoxic goiter, unspecified: Secondary | ICD-10-CM | POA: Diagnosis not present

## 2017-01-06 DIAGNOSIS — E049 Nontoxic goiter, unspecified: Secondary | ICD-10-CM | POA: Diagnosis not present

## 2017-01-07 ENCOUNTER — Other Ambulatory Visit (HOSPITAL_COMMUNITY): Payer: Self-pay | Admitting: Internal Medicine

## 2017-01-07 ENCOUNTER — Encounter (HOSPITAL_COMMUNITY): Payer: Self-pay

## 2017-01-07 ENCOUNTER — Ambulatory Visit (HOSPITAL_COMMUNITY)
Admission: RE | Admit: 2017-01-07 | Discharge: 2017-01-07 | Disposition: A | Discharge status: Home / Self Care | Payer: 59 | Source: Ambulatory Visit | Attending: Internal Medicine | Admitting: Internal Medicine

## 2017-01-07 DIAGNOSIS — Z1231 Encounter for screening mammogram for malignant neoplasm of breast: Secondary | ICD-10-CM | POA: Diagnosis not present

## 2017-09-02 ENCOUNTER — Ambulatory Visit (INDEPENDENT_AMBULATORY_CARE_PROVIDER_SITE_OTHER): Payer: 59 | Admitting: Family Medicine

## 2017-09-02 ENCOUNTER — Encounter: Payer: Self-pay | Admitting: Family Medicine

## 2017-09-02 VITALS — BP 104/68 | HR 80 | Temp 97.8°F | Ht 61.25 in | Wt 100.8 lb

## 2017-09-02 DIAGNOSIS — K219 Gastro-esophageal reflux disease without esophagitis: Secondary | ICD-10-CM

## 2017-09-02 DIAGNOSIS — B352 Tinea manuum: Secondary | ICD-10-CM

## 2017-09-02 DIAGNOSIS — R7309 Other abnormal glucose: Secondary | ICD-10-CM

## 2017-09-02 DIAGNOSIS — K635 Polyp of colon: Secondary | ICD-10-CM

## 2017-09-02 DIAGNOSIS — M21619 Bunion of unspecified foot: Secondary | ICD-10-CM | POA: Diagnosis not present

## 2017-09-02 DIAGNOSIS — L230 Allergic contact dermatitis due to metals: Secondary | ICD-10-CM

## 2017-09-02 HISTORY — DX: Polyp of colon: K63.5

## 2017-09-02 LAB — POCT GLYCOSYLATED HEMOGLOBIN (HGB A1C): Hemoglobin A1C: 5.5 % (ref 4.0–5.6)

## 2017-09-02 MED ORDER — KETOCONAZOLE 2 % EX CREA: 1.0000 "application " | TOPICAL_CREAM | Freq: Two times a day (BID) | CUTANEOUS | 0 refills | Status: AC

## 2017-09-02 NOTE — Patient Instructions (Signed)
Please return in 3 months for your annual complete physical; please come fasting.  Please get release of records from Dr. Nance Pew, [facility] - need colonoscopy results please .  It was a pleasure meeting you today! Thank you for choosing [place] to meet your healthcare needs! I truly look forward to working with you. If you have any questions or concerns, please send me a message via Mychart or call the office at [phone].  Bunion A bunion is a bump on the base of the big toe that forms when the bones of the big toe joint move out of position. Bunions may be small at first, but they often get larger over time. The can make walking painful. What are the causes? A bunion may be caused by:  Wearing narrow or pointed shoes that force the big toe to press against the other toes.  Abnormal foot development that causes the foot to roll inward (pronate).  Changes in the foot that are caused by certain diseases, such as rheumatoid arthritis and polio.  A foot injury.  What increases the risk? The following factors may make you more likely to develop this condition:  Wearing shoes that squeeze the toes together.  Having certain diseases, such as: ? Rheumatoid arthritis. ? Polio. ? Cerebral palsy.  Having family members who have bunions.  Being born with a foot deformity, such as flat feet or low arches.  Doing activities that put a lot of pressure on the feet, such as ballet dancing.  What are the signs or symptoms? The main symptom of a bunion is a noticeable bump on the big toe. Other symptoms may include:  Pain.  Swelling around the big toe.  Redness and inflammation.  Thick or hardened skin on the big toe or between the toes.  Stiffness or loss of motion in the big toe.  Trouble with walking.  How is this diagnosed? A bunion may be diagnosed based on your symptoms, medical history, and activities. You may have tests, such as:  X-rays. These allow your health care  provider to check the position of the bones in your foot and look for damage to your joint. They also help your health care provider to determine the severity of your bunion and the best way to treat it.  Joint aspiration. In this test, a sample of fluid is removed from the toe joint. This test, which may be done if you are in a lot of pain, helps to rule out diseases that cause painful swelling of the joints, such as arthritis.  How is this treated? There is no cure for a bunion, but treatment can help to prevent a bunion from getting worse. Treatment depends on the severity of your symptoms. Your health care provider may recommend:  Wearing shoes that have a wide toe box.  Using bunion pads to cushion the affected area.  Taping your toes together to keep them in a normal position.  Placing a device inside your shoe (orthotics) to help reduce pressure on your toe joint.  Taking medicine to ease pain, inflammation, and swelling.  Applying heat or ice to the affected area.  Doing stretching exercises.  Surgery to remove scar tissue and move the toes back into their normal position. This treatment is rare.  Follow these instructions at home:  Support your toe joint with proper footwear, shoe padding, or taping as told by your health care provider.  Take over-the-counter and prescription medicines only as told by your health  care provider.  If directed, apply ice to the injured area: ? Put ice in a plastic bag. ? Place a towel between your skin and the bag. ? Leave the ice on for 20 minutes, 2-3 times per day.  If directed, apply heat to the affected area before you exercise. Use the heat source that your health care provider recommends, such as a moist heat pack or a heating pad. ? Place a towel between your skin and the heat source. ? Leave the heat on for 20-30 minutes. ? Remove the heat if your skin turns bright red. This is especially important if you are unable to feel pain,  heat, or cold. You may have a greater risk of getting burned.  Do exercises as told by your health care provider.  Keep all follow-up visits as told by your health care provider. Contact a health care provider if:  Your symptoms get worse.  Your symptoms do not improve in 2 weeks. Get help right away if:  You have severe pain and trouble with walking. This information is not intended to replace advice given to you by your health care provider. Make sure you discuss any questions you have with your health care provider. Document Released: 02/08/2005 Document Revised: 07/17/2015 Document Reviewed: 09/08/2014 Elsevier Interactive Patient Education  [employer]2018 [employer]

## 2017-09-02 NOTE — Progress Notes (Signed)
Subjective  CC:  Chief Complaint  Patient presents with  . Establish Care    HPI: Diana Taylor is a 52 y.o. female who presents to [facility] today to establish care with me as a new patient.  I have reviewed records: immunization records, [facility] reports from her mychart account and old [facility] records from care everywhere. Has been seeing [facility] Wynelle LinkSun [facility]. Last CPE 11/2016: nl Cmp, cbc, lipids. TSH was borderline low with nl T4 and T3 and nl thyroid ultrasound. Nl Vit D.  She has the following concerns or needs:  Has h/o elevated a1c in prediabetic range. 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 No sxs of hyperglycemia. No FH of CM.   Rashes: at belt line and on hands; hand rash x 2 months. Seen [facility] and txd with triamcinolone cream w/o relief. Itching.   Bunions: now painful with standing all day  H/o gerd but not currently active. Has used omeprazole in past  HM: CRC reported with polyp; [facility] - need report. mammo due in November [facility] Pap smear nl 2018, 2016; 2015 had benign appearing endometrial cells (premenopausal at the time). All imms are up to date. Records reviewed and abstracted.   Assessment  1. High glucose level   2. Gastroesophageal reflux disease, esophagitis presence not specified   3. Polyp of colon, unspecified part of colon, unspecified type   4. Bunion   5. Tinea manus   6. Nickel dermatitis      Plan   H/o prediabetes, now resolved. Will monitor.   GERD is controlled  Request colonoscopy report to make recs regarding surveillance interval  Bunion: supportive care; discussed shoes. To podiatry if worsens  Tinea manus: antifungal cream ordered.  Nickel contact derm: tac cream and stop exposure discussed  HM: next cpe due in october  Follow up:  Return in about 3 months (around  12/03/2017) for complete physical. Orders Placed This Encounter  Procedures  . POCT glycosylated hemoglobin (Hb A1C)   Meds ordered this encounter  Medications  . ketoconazole (NIZORAL) 2 % cream    Sig: Apply 1 application topically 2 (two) times daily for 7 days.    Dispense:  30 g    Refill:  0     No flowsheet data found.  We updated and reviewed the patient's past history in detail and it is documented below.  Patient Active Problem List   Diagnosis Date Noted  . Colon polyp 09/02/2017  . Bunion 09/02/2017  . GERD (gastroesophageal reflux disease)    Health Maintenance  Topic Date Due  . Hepatitis C Screening  [birth year]  . INFLUENZA VACCINE  09/22/2017  . MAMMOGRAM  01/07/2018  . COLONOSCOPY  02/23/2019  . PAP SMEAR  12/23/2019  . TETANUS/TDAP  09/14/2023  . HIV Screening  Completed   Immunization History  Administered Date(s) Administered  . Tdap 09/13/2013   Current Meds  Medication Sig  . cholecalciferol (VITAMIN D) 1000 units tablet Take 1,000 Units by mouth daily.  Marland Kitchen. triamcinolone cream (KENALOG) 0.1 % Apply topically 2 (two) times daily.    Allergies: Patient has No Known Allergies. Past Medical History Patient  has a past medical history of Allergy, Colon polyp (09/02/2017), GERD (gastroesophageal reflux disease), and Vitamin D deficiency. Past Surgical History Patient  has no past surgical history on  file. Family History: Patient family history includes COPD in her brother and father; Healthy in her daughter; Hypertension in her mother; Nephrolithiasis in her sister; Stroke (age of onset: 23) in her mother. Social History:  Patient  reports that she has never smoked. She has never used smokeless tobacco. She reports that she does not drink alcohol or use drugs.  Review of Systems: Constitutional: negative for fever or malaise Ophthalmic: negative for photophobia, double vision or loss of vision Cardiovascular: negative for chest pain, dyspnea on  exertion, or new LE swelling Respiratory: negative for SOB or persistent cough Gastrointestinal: negative for abdominal pain, change in bowel habits or melena Genitourinary: negative for dysuria or gross hematuria Musculoskeletal: negative for new gait disturbance or muscular weakness Integumentary: negative for new or persistent rashes Neurological: negative for TIA or stroke symptoms Psychiatric: negative for SI or delusions Allergic/Immunologic: negative for hives  Patient Care Team    Relationship Specialty Notifications Start End  Willow Ora, MD PCP - General Family Medicine  09/02/17     Objective  Vitals: BP 104/68 (BP Location: Right Arm, Patient Position: Sitting, Cuff Size: Normal)   Pulse 80   Temp 97.8 F (36.6 C) (Oral)   Ht 5' 1.25" (1.556 m)   Wt 100 lb 12.8 oz (45.7 kg)   LMP 05/28/2017 (Exact Date)   SpO2 97%   BMI 18.89 kg/m  General:  Well developed, well nourished, no acute distress , thin Psych:  Alert and oriented,normal mood and affect HEENT:  Normocephalic, atraumatic, non-icteric sclera, PERRL, oropharynx is without mass or exudate, supple neck without adenopathy, mass or thyromegaly Cardiovascular:  RRR without gallop, rub or murmur, nondisplaced PMI Respiratory:  Good breath sounds bilaterally, CTAB with normal respiratory effort MSK: no deformities, contusions. Joints are without erythema or swelling Skin:  Warm, bilateral dorsal hands with annular rash, raised red borders and clear center; erythematous rash localized at belt line, worse at pant button location Neurologic:    Mental status is normal. Normal gait Lab Results  Component Value Date   HGBA1C 5.5 09/02/2017   HGBA1C 5.9 (H) 09/13/2013      Commons side effects, risks, benefits, and alternatives for medications and treatment plan prescribed today were discussed, and the patient expressed understanding of the given instructions. Patient is instructed to call or message via MyChart if  he/she has any questions or concerns regarding our treatment plan. No barriers to understanding were identified. We discussed Red Flag symptoms and signs in detail. Patient expressed understanding regarding what to do in case of urgent or emergency type symptoms.   Medication list was reconciled, printed and provided to the patient in AVS. Patient instructions and summary information was reviewed with the patient as documented in the AVS. This note was prepared with assistance of Dragon voice recognition software. Occasional wrong-word or sound-a-like substitutions may have occurred due to the inherent limitations of voice recognition software

## 2017-11-25 ENCOUNTER — Encounter: Payer: 59 | Admitting: Family Medicine

## 2017-12-26 DIAGNOSIS — Z0000 Encounter for general adult medical examination without abnormal findings: Secondary | ICD-10-CM | POA: Diagnosis not present

## 2017-12-26 DIAGNOSIS — E559 Vitamin D deficiency, unspecified: Secondary | ICD-10-CM | POA: Diagnosis not present

## 2017-12-26 DIAGNOSIS — Z114 Encounter for screening for human immunodeficiency virus [HIV]: Secondary | ICD-10-CM | POA: Diagnosis not present

## 2018-01-09 DIAGNOSIS — R7303 Prediabetes: Secondary | ICD-10-CM | POA: Diagnosis not present

## 2018-01-09 DIAGNOSIS — L301 Dyshidrosis [pompholyx]: Secondary | ICD-10-CM | POA: Diagnosis not present

## 2018-01-18 ENCOUNTER — Other Ambulatory Visit (HOSPITAL_COMMUNITY): Payer: Self-pay | Admitting: Internal Medicine

## 2018-01-18 DIAGNOSIS — Z1231 Encounter for screening mammogram for malignant neoplasm of breast: Secondary | ICD-10-CM

## 2018-02-01 ENCOUNTER — Ambulatory Visit (HOSPITAL_COMMUNITY)
Admission: RE | Admit: 2018-02-01 | Discharge: 2018-02-01 | Disposition: A | Discharge status: Home / Self Care | Payer: 59 | Source: Ambulatory Visit | Attending: Internal Medicine | Admitting: Internal Medicine

## 2018-02-01 DIAGNOSIS — Z1231 Encounter for screening mammogram for malignant neoplasm of breast: Secondary | ICD-10-CM | POA: Insufficient documentation

## 2018-04-04 ENCOUNTER — Other Ambulatory Visit (HOSPITAL_COMMUNITY)
Admission: RE | Admit: 2018-04-04 | Discharge: 2018-04-04 | Disposition: A | Discharge status: Home / Self Care | Payer: No Typology Code available for payment source | Source: Ambulatory Visit | Attending: Pulmonary Disease | Admitting: Pulmonary Disease

## 2018-04-04 DIAGNOSIS — Z0000 Encounter for general adult medical examination without abnormal findings: Secondary | ICD-10-CM | POA: Insufficient documentation

## 2018-04-04 DIAGNOSIS — Z8639 Personal history of other endocrine, nutritional and metabolic disease: Secondary | ICD-10-CM | POA: Diagnosis present

## 2018-04-04 LAB — COMPREHENSIVE METABOLIC PANEL
ALBUMIN: 4.3 g/dL (ref 3.5–5.0)
ALT: 13 U/L (ref 0–44)
AST: 16 U/L (ref 15–41)
Alkaline Phosphatase: 45 U/L (ref 38–126)
Anion gap: 6 (ref 5–15)
BILIRUBIN TOTAL: 0.4 mg/dL (ref 0.3–1.2)
BUN: 14 mg/dL (ref 6–20)
CHLORIDE: 107 mmol/L (ref 98–111)
CO2: 27 mmol/L (ref 22–32)
CREATININE: 0.51 mg/dL (ref 0.44–1.00)
Calcium: 8.8 mg/dL — ABNORMAL LOW (ref 8.9–10.3)
GFR calc Af Amer: >60 mL/min (ref >60)
GFR calc non Af Amer: >60 mL/min (ref >60)
GLUCOSE: 94 mg/dL (ref 70–99)
POTASSIUM: 4.2 mmol/L (ref 3.5–5.1)
Sodium: 140 mmol/L (ref 135–145)
Total Protein: 7.1 g/dL (ref 6.5–8.1)

## 2018-04-04 LAB — LIPID PANEL
CHOL/HDL RATIO: 2 ratio
Cholesterol: 156 mg/dL (ref 0–200)
HDL: 77 mg/dL (ref >40)
LDL CALC: 72 mg/dL (ref 0–99)
Triglycerides: 33 mg/dL (ref <150)
VLDL: 7 mg/dL (ref 0–40)

## 2018-04-04 LAB — TSH: TSH: 0.543 u[IU]/mL (ref 0.350–4.500)

## 2018-04-04 LAB — CBC
HEMATOCRIT: 43.3 % (ref 36.0–46.0)
Hemoglobin: 13.7 g/dL (ref 12.0–15.0)
MCH: 30.2 pg (ref 26.0–34.0)
MCHC: 31.6 g/dL (ref 30.0–36.0)
MCV: 95.6 fL (ref 80.0–100.0)
NRBC: 0 % (ref 0.0–0.2)
Platelets: 242 10*3/uL (ref 150–400)
RBC: 4.53 MIL/uL (ref 3.87–5.11)
RDW: 11.8 % (ref 11.5–15.5)
WBC: 4.2 10*3/uL (ref 4.0–10.5)

## 2018-11-28 ENCOUNTER — Encounter (INDEPENDENT_AMBULATORY_CARE_PROVIDER_SITE_OTHER): Payer: Self-pay | Admitting: Nurse Practitioner

## 2018-12-05 ENCOUNTER — Encounter: Payer: Self-pay | Admitting: Orthopaedic Surgery

## 2018-12-05 ENCOUNTER — Ambulatory Visit: Payer: No Typology Code available for payment source

## 2018-12-05 ENCOUNTER — Other Ambulatory Visit: Payer: Self-pay

## 2018-12-05 ENCOUNTER — Ambulatory Visit: Payer: No Typology Code available for payment source | Admitting: Orthopaedic Surgery

## 2018-12-05 VITALS — BP 121/78 | HR 76 | Ht 61.0 in | Wt 99.0 lb

## 2018-12-05 DIAGNOSIS — G8929 Other chronic pain: Secondary | ICD-10-CM

## 2018-12-05 DIAGNOSIS — M25511 Pain in right shoulder: Secondary | ICD-10-CM | POA: Diagnosis not present

## 2018-12-05 MED ORDER — NAPROXEN 500 MG PO TABS: 500.0000 mg | ORAL_TABLET | Freq: Two times a day (BID) | ORAL | 5 refills | Status: DC

## 2018-12-05 NOTE — Progress Notes (Signed)
Subjective:    Patient ID: Diana Taylor, female    DOB: 02-04-1966, 53 y.o.   MRN: [id number]  HPI  She has right shoulder pain for over two months after a fall.  She is not getting any better.  She has pain with overhead use and sleeping on it. She has seen Dr. Dwana Melena and I have reviewed his notes.  She has tried Tylenol and Advil with no help.  Heat and ice have not helped.  She has no redness, no weakness, no swelling.    Review of Systems  Constitutional: Positive for activity change.  Musculoskeletal: Positive for arthralgias and back pain.  Allergic/Immunologic: Positive for environmental allergies and food allergies.  All other systems reviewed and are negative.  For Review of Systems, all other systems reviewed and are negative.  The following is a summary of the past history medically, past history surgically, known current medicines, social history and family history.  This information is gathered electronically by the computer from prior information and documentation.  I review this each visit and have found including this information at this point in the chart is beneficial and informative.   Past Medical History:  Diagnosis Date  . Allergy   . Colon polyp 09/02/2017  . GERD (gastroesophageal reflux disease)   . Vitamin D deficiency    corrected 2018    History reviewed. No pertinent surgical history.  Current Outpatient Medications on File Prior to Visit  Medication Sig Dispense Refill  . cholecalciferol (VITAMIN D) 1000 units tablet Take 1,000 Units by mouth daily.    Marland Kitchen triamcinolone cream (KENALOG) 0.1 % Apply topically 2 (two) times daily. 60 g 1   No current facility-administered medications on file prior to visit.     Social History   Socioeconomic History  . Marital status: Married    Spouse name: Not on file  . Number of children: 1  . Years of education: Not on file  . Highest education level: Not on file  Occupational History  . Occupation:  Producer, television/film/video: [employer]    Comment: former job  . 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 [facility]  Social Needs  . Financial resource strain: Not on file  . Food insecurity    Worry: Not on file    Inability: Not on file  . Transportation needs    Medical: Not on file    Non-medical: Not on file  Tobacco Use  . Smoking status: Never Smoker  . Smokeless tobacco: Never Used  Substance and Sexual Activity  . Alcohol use: No  . Drug use: No  . Sexual activity: Yes    Birth control/protection: Post-menopausal  Lifestyle  . Physical activity    Days per week: Not on file    Minutes per session: Not on file  . Stress: Not on file  Relationships  . Social Musician on phone: Not on file    Gets together: Not on file    Attends religious service: Not on file    Active member of club or organization: Not on file    Attends meetings of clubs or organizations: Not on file    Relationship status: Not on file  . Intimate partner violence    Fear of current or ex partner: Not on file    Emotionally abused: Not on file    Physically abused: Not on file    Forced sexual activity: Not on file  Other Topics Concern  . Not on file  Social History Narrative   Marital status: married x 25 years; from [place]; moved to [place] 2001      Children: 1 child (25); no grandchildren      Lives: with husband.      Employment: unemployed      Tobacco; none      Alcohol: none      Drugs: none      Exercise:  Walking daily.    Family History  Problem Relation Age of Onset  . Stroke Mother 52       CVA  . Hypertension Mother   . COPD Father   . Nephrolithiasis Sister   . COPD Brother   . Healthy Daughter     BP 121/78   Pulse 76   Ht 5\' 1"  (1.549 m)   Wt 99 lb (44.9 kg)   BMI 18.71 kg/m   Body mass index is 18.71 kg/m.     Objective:   Physical Exam Vitals signs reviewed.  Constitutional:      Appearance: She is well-developed.   HENT:     Head: Normocephalic and atraumatic.  Eyes:     Conjunctiva/sclera: Conjunctivae normal.     Pupils: Pupils are equal, round, and reactive to light.  Neck:     Musculoskeletal: Normal range of motion and neck supple.  Cardiovascular:     Rate and Rhythm: Normal rate and regular rhythm.  Pulmonary:     Effort: Pulmonary effort is normal.  Abdominal:     Palpations: Abdomen is soft.  Musculoskeletal:     Right shoulder: She exhibits decreased range of motion and tenderness.       Arms:  Skin:    General: Skin is warm and dry.  Neurological:     Mental Status: She is alert and oriented to person, place, and time.     Cranial Nerves: No cranial nerve deficit.     Motor: No abnormal muscle tone.     Coordination: Coordination normal.     Deep Tendon Reflexes: Reflexes are normal and symmetric. Reflexes normal.  Psychiatric:        Behavior: Behavior normal.        Thought Content: Thought content normal.        Judgment: Judgment normal.   x-rays were done of the right shoulder, reported separately.        Assessment & Plan:   Encounter Diagnosis  Name Primary?  . Chronic right shoulder pain Yes   PROCEDURE NOTE:  The patient request injection, verbal consent was obtained.  The right shoulder was prepped appropriately after time out was performed.   Sterile technique was observed and injection of 1 cc of Depo-Medrol 40 mg with several cc's of plain xylocaine. Anesthesia was provided by ethyl chloride and a 20-gauge needle was used to inject the shoulder area. A posterior approach was used.  The injection was tolerated well.  A band aid dressing was applied.  The patient was advised to apply ice later today and tomorrow to the injection sight as needed.  I feel she has bursitis of the right shoulder.  I will begin also naprosyn.  Return in two weeks.  Call if any problem.  Precautions discussed.   Electronically Signed Sanjuana Kava, MD  10/13/20209:30 AM

## 2018-12-19 ENCOUNTER — Ambulatory Visit: Payer: No Typology Code available for payment source | Admitting: Orthopaedic Surgery

## 2018-12-21 ENCOUNTER — Other Ambulatory Visit: Payer: Self-pay

## 2018-12-21 ENCOUNTER — Ambulatory Visit (INDEPENDENT_AMBULATORY_CARE_PROVIDER_SITE_OTHER): Payer: No Typology Code available for payment source | Admitting: Orthopaedic Surgery

## 2018-12-21 ENCOUNTER — Encounter: Payer: Self-pay | Admitting: Orthopaedic Surgery

## 2018-12-21 VITALS — BP 120/67 | HR 85 | Temp 98.6°F | Ht 61.0 in | Wt 101.5 lb

## 2018-12-21 DIAGNOSIS — G8929 Other chronic pain: Secondary | ICD-10-CM | POA: Diagnosis not present

## 2018-12-21 DIAGNOSIS — M25511 Pain in right shoulder: Secondary | ICD-10-CM

## 2018-12-21 MED ORDER — PREDNISONE 5 MG (21) PO TBPK: ORAL_TABLET | ORAL | 0 refills | Status: DC

## 2018-12-21 NOTE — Progress Notes (Signed)
Patient NO:IBBCWU Diana Taylor, female DOB:[birth year], 53 y.o. GQB:[id number]  Chief Complaint  Patient presents with  . Shoulder Pain    R/alittle better    HPI  Diana Taylor is a 53 y.o. female who has right shoulder pain.  She has gotten a little better but still hurts.  She could not tolerate the Naprosyn.  It bothered her stomach.  She is doing her exercises.  She is sleeping a little better.  I will give prednisone dose pack.  Continue her exercises.   Body mass index is 19.18 kg/m.  ROS  Review of Systems  Constitutional: Positive for activity change.  Musculoskeletal: Positive for arthralgias and back pain.  Allergic/Immunologic: Positive for environmental allergies and food allergies.  All other systems reviewed and are negative.   All other systems reviewed and are negative.  The following is a summary of the past history medically, past history surgically, known current medicines, social history and family history.  This information is gathered electronically by the computer from prior information and documentation.  I review this each visit and have found including this information at this point in the chart is beneficial and informative.    Past Medical History:  Diagnosis Date  . Allergy   . Colon polyp 09/02/2017  . GERD (gastroesophageal reflux disease)   . Vitamin D deficiency    corrected 2018    History reviewed. No pertinent surgical history.  Family History  Problem Relation Age of Onset  . Stroke Mother 34       CVA  . Hypertension Mother   . COPD Father   . Nephrolithiasis Sister   . COPD Brother   . Healthy Daughter     Social History Social History   Tobacco Use  . Smoking status: Never Smoker  . Smokeless tobacco: Never Used  Substance Use Topics  . Alcohol use: No  . Drug use: No    No Known Allergies  Current Outpatient Medications  Medication Sig Dispense Refill  . cholecalciferol (VITAMIN D) 1000 units tablet Take 1,000  Units by mouth daily.    . naproxen (NAPROSYN) 500 MG tablet Take 1 tablet (500 mg total) by mouth 2 (two) times daily with a meal. (Patient not taking: Reported on 12/21/2018) 60 tablet 5  . predniSONE (STERAPRED UNI-PAK 21 TAB) 5 MG (21) TBPK tablet Take 6 pills first day; 5 pills second day; 4 pills third day; 3 pills fourth day; 2 pills next day and 1 pill last day. 21 tablet 0  . triamcinolone cream (KENALOG) 0.1 % Apply topically 2 (two) times daily. 60 g 1   No current facility-administered medications for this visit.      Physical Exam  Blood pressure 120/67, pulse 85, temperature 98.6 F (37 C), height 5\' 1"  (1.549 m), weight 101 lb 8 oz (46 kg).  Constitutional: overall normal hygiene, normal nutrition, well developed, normal grooming, normal body habitus. Assistive device:none  Musculoskeletal: gait and station Limp none, muscle tone and strength are normal, no tremors or atrophy is present.  .  Neurological: coordination overall normal.  Deep tendon reflex/nerve stretch intact.  Sensation normal.  Cranial nerves II-XII intact.   Skin:   Normal overall no scars, lesions, ulcers or rashes. No psoriasis.  Psychiatric: Alert and oriented x 3.  Recent memory intact, remote memory unclear.  Normal mood and affect. Well groomed.  Good eye contact.  Cardiovascular: overall no swelling, no varicosities, no edema bilaterally, normal temperatures of the legs and arms,  no clubbing, cyanosis and good capillary refill.  Lymphatic: palpation is normal.  Examination of right Upper Extremity is done.  Inspection:   Overall:  Elbow non-tender without crepitus or defects, forearm non-tender without crepitus or defects, wrist non-tender without crepitus or defects, hand non-tender.    Shoulder: with glenohumeral joint tenderness, without effusion.   Upper arm:  without swelling and tenderness   Range of motion:   Overall:  Full range of motion of the elbow, full range of motion of wrist  and full range of motion in fingers.   Shoulder:  right  160 degrees forward flexion; 145 degrees abduction; 30 degrees internal rotation, 30 degrees external rotation, 10 degrees extension, 40 degrees adduction.   Stability:   Overall:  Shoulder, elbow and wrist stable   Strength and Tone:   Overall full shoulder muscles strength, full upper arm strength and normal upper arm bulk and tone.  All other systems reviewed and are negative   The patient has been educated about the nature of the problem(s) and counseled on treatment options.  The patient appeared to understand what I have discussed and is in agreement with it.  Encounter Diagnosis  Name Primary?  . Chronic right shoulder pain Yes    PLAN Call if any problems.  Precautions discussed.  Continue current medications.   Return to clinic 3 weeks   Prednisone dose pack called in.  Electronically Signed Darreld Mclean, MD 10/29/20208:18 AM

## 2019-01-11 ENCOUNTER — Ambulatory Visit (INDEPENDENT_AMBULATORY_CARE_PROVIDER_SITE_OTHER): Payer: No Typology Code available for payment source | Admitting: Orthopaedic Surgery

## 2019-01-11 ENCOUNTER — Other Ambulatory Visit: Payer: Self-pay

## 2019-01-11 ENCOUNTER — Encounter: Payer: Self-pay | Admitting: Orthopaedic Surgery

## 2019-01-11 VITALS — BP 104/67 | HR 77 | Temp 97.2°F | Ht 61.0 in | Wt 103.2 lb

## 2019-01-11 DIAGNOSIS — M25511 Pain in right shoulder: Secondary | ICD-10-CM | POA: Diagnosis not present

## 2019-01-11 DIAGNOSIS — G8929 Other chronic pain: Secondary | ICD-10-CM

## 2019-01-11 NOTE — Progress Notes (Signed)
Patient Diana Taylor, female DOB:Apr 28, 1965, 53 y.o. RCV:[id number]  Chief Complaint  Patient presents with  . Shoulder Pain    Better but still can't use it behind back    HPI  Diana Taylor is a 53 y.o. female who has right shoulder pain.  She is a little better. She is sleeping more. She has some pain with overhead use but not as much.  She has no new trauma,no swelling, no numbness.   Body mass index is 19.51 kg/m.  ROS  Review of Systems  Constitutional: Positive for activity change.  Musculoskeletal: Positive for arthralgias and back pain.  Allergic/Immunologic: Positive for environmental allergies and food allergies.  All other systems reviewed and are negative.   All other systems reviewed and are negative.  The following is a summary of the past history medically, past history surgically, known current medicines, social history and family history.  This information is gathered electronically by the computer from prior information and documentation.  I review this each visit and have found including this information at this point in the chart is beneficial and informative.    Past Medical History:  Diagnosis Date  . Allergy   . Colon polyp 09/02/2017  . GERD (gastroesophageal reflux disease)   . Vitamin D deficiency    corrected 2018    History reviewed. No pertinent surgical history.  Family History  Problem Relation Age of Onset  . Stroke Mother 62       CVA  . Hypertension Mother   . COPD Father   . Nephrolithiasis Sister   . COPD Brother   . Healthy Daughter     Social History Social History   Tobacco Use  . Smoking status: Never Smoker  . Smokeless tobacco: Never Used  Substance Use Topics  . Alcohol use: No  . Drug use: No    No Known Allergies  Current Outpatient Medications  Medication Sig Dispense Refill  . cholecalciferol (VITAMIN D) 1000 units tablet Take 1,000 Units by mouth daily.    . naproxen (NAPROSYN) 500 MG tablet Take  1 tablet (500 mg total) by mouth 2 (two) times daily with a meal. (Patient not taking: Reported on 12/21/2018) 60 tablet 5  . predniSONE (STERAPRED UNI-PAK 21 TAB) 5 MG (21) TBPK tablet Take 6 pills first day; 5 pills second day; 4 pills third day; 3 pills fourth day; 2 pills next day and 1 pill last day. 21 tablet 0  . triamcinolone cream (KENALOG) 0.1 % Apply topically 2 (two) times daily. 60 g 1   No current facility-administered medications for this visit.      Physical Exam  Blood pressure 104/67, pulse 77, temperature (!) 97.2 F (36.2 C), height 5\' 1"  (1.549 m), weight 103 lb 4 oz (46.8 kg).  Constitutional: overall normal hygiene, normal nutrition, well developed, normal grooming, normal body habitus. Assistive device:none  Musculoskeletal: gait and station Limp none, muscle tone and strength are normal, no tremors or atrophy is present.  .  Neurological: coordination overall normal.  Deep tendon reflex/nerve stretch intact.  Sensation normal.  Cranial nerves II-XII intact.   Skin:   Normal overall no scars, lesions, ulcers or rashes. No psoriasis.  Psychiatric: Alert and oriented x 3.  Recent memory intact, remote memory unclear.  Normal mood and affect. Well groomed.  Good eye contact.  Cardiovascular: overall no swelling, no varicosities, no edema bilaterally, normal temperatures of the legs and arms, no clubbing, cyanosis and good capillary refill.  Lymphatic:  palpation is normal.  Right shoulder has pain in the extremes but good motion.  NV intact.  All other systems reviewed and are negative   The patient has been educated about the nature of the problem(s) and counseled on treatment options.  The patient appeared to understand what I have discussed and is in agreement with it.  Encounter Diagnosis  Name Primary?  . Chronic right shoulder pain Yes    PLAN Call if any problems.  Precautions discussed.  Continue current medications.   Return to clinic 2  months   I told her to get some Aspercreme, BioFreeze or Voltaren Gel and rub it in.  Electronically Signed Sanjuana Kava, MD 11/19/20209:37 AM

## 2019-01-25 ENCOUNTER — Other Ambulatory Visit (HOSPITAL_COMMUNITY): Payer: Self-pay | Admitting: Internal Medicine

## 2019-01-25 ENCOUNTER — Ambulatory Visit (INDEPENDENT_AMBULATORY_CARE_PROVIDER_SITE_OTHER): Payer: No Typology Code available for payment source | Admitting: Nurse Practitioner

## 2019-01-25 DIAGNOSIS — Z1231 Encounter for screening mammogram for malignant neoplasm of breast: Secondary | ICD-10-CM

## 2019-02-08 ENCOUNTER — Ambulatory Visit (HOSPITAL_COMMUNITY)
Admission: RE | Admit: 2019-02-08 | Discharge: 2019-02-08 | Disposition: A | Discharge status: Home / Self Care | Payer: No Typology Code available for payment source | Source: Ambulatory Visit | Attending: Internal Medicine | Admitting: Internal Medicine

## 2019-02-08 ENCOUNTER — Other Ambulatory Visit: Payer: Self-pay

## 2019-02-08 DIAGNOSIS — Z1231 Encounter for screening mammogram for malignant neoplasm of breast: Secondary | ICD-10-CM | POA: Diagnosis present

## 2019-03-06 ENCOUNTER — Encounter (INDEPENDENT_AMBULATORY_CARE_PROVIDER_SITE_OTHER): Payer: Self-pay | Admitting: Internal Medicine

## 2019-03-06 ENCOUNTER — Ambulatory Visit (INDEPENDENT_AMBULATORY_CARE_PROVIDER_SITE_OTHER): Payer: No Typology Code available for payment source | Admitting: Internal Medicine

## 2019-03-06 ENCOUNTER — Other Ambulatory Visit: Payer: Self-pay

## 2019-03-06 DIAGNOSIS — R636 Underweight: Secondary | ICD-10-CM | POA: Diagnosis not present

## 2019-03-06 DIAGNOSIS — K921 Melena: Secondary | ICD-10-CM | POA: Diagnosis not present

## 2019-03-06 NOTE — Patient Instructions (Signed)
Will request records of prior colonoscopy and hemorrhoidal banding. Please notify if you have another episode of bleeding.

## 2019-03-06 NOTE — Progress Notes (Signed)
Reason for consultation  Rectal bleeding. History of hemorrhoids.  History of present illness  Patient is 54 year old female who is referred through courtesy of Dr. Catalina Pizza for GI evaluation. Patient recalls that she had an episode of rectal bleeding with her bowel movement about 3 weeks ago.  She feels a moderate amount of bright red blood.  She denies chronic constipation.  She says if she eats healthy she has formed stools.  Every now and then she may have diarrhea which is self-limiting.  She says she had colonoscopy by Dr. Rodena Piety of [facility] about 4 years ago and she had hemorrhoidal banding twice.  She did not have any problems until this episode.  She denies abdominal pain nausea vomiting or weight loss.  She says she has good appetite.  She is 3 meals a day.  She has occasional heartburn with certain foods but she denies nausea vomiting or dysphagia.  She walks at least twice a week for 45 minutes each time. She says she is not taking naproxen anymore.  Current Medications: Outpatient Encounter Medications as of 03/06/2019  Medication Sig  . cholecalciferol (VITAMIN D) 1000 units tablet Take 1,000 Units by mouth daily.  . naproxen (NAPROSYN) 500 MG tablet Take 1 tablet (500 mg total) by mouth 2 (two) times daily with a meal. (Patient not taking: Reported on 12/21/2018)  . predniSONE (STERAPRED UNI-PAK 21 TAB) 5 MG (21) TBPK tablet Take 6 pills first day; 5 pills second day; 4 pills third day; 3 pills fourth day; 2 pills next day and 1 pill last day. (Patient not taking: Reported on 03/06/2019)  . triamcinolone cream (KENALOG) 0.1 % Apply topically 2 (two) times daily. (Patient not taking: Reported on 03/06/2019)   No facility-administered encounter medications on file as of 03/06/2019.   Past medical history  History of vitamin D deficiency History of hemorrhoids.  Status post hemorrhoidal banding 4 years ago.  Allergies  No Known Allergies  Family  history  Mother lived to be 45.  She died because of CVA. Father had chronic lung disease and died at age 40. She has 3 siblings who live in [place].  Brother 6 years old sisters are 46 and 67 years of age.  Social history  Patient is married.  She has daughter age 67 who has some health issues.  She immigrated from [place] 20 years ago.  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  Previously she is works at [employer] and another [employer]  She has never smoked cigarettes and does not drink alcohol.  Physical examination  Blood pressure 113/71, pulse 78, temperature (!) 94.1 F (34.5 C), temperature source Temporal, height 5\' 1"  (1.549 m), weight 104 lb 1.6 oz (47.2 kg), last menstrual period 02/01/2018. Patient is alert and in no acute distress. She is wearing a facial mask. Conjunctiva is pink. Sclera is nonicteric Oropharyngeal mucosa is normal. No neck masses or thyromegaly noted. Cardiac exam with regular rhythm normal S1 and S2. No murmur or gallop noted. Lungs are clear to auscultation. Abdomen is flat.  Bowel sounds are normal.  On palpation abdomen is soft and nontender with organomegaly or masses. Rectal examination reveals tiny anal tag anteriorly.  Digital exam is normal.  She has formed stool in the vault.  Stool is guaiac negative. No LE edema or clubbing noted.  Labs/studies Results: Lab data from 12/06/2018 WBC 5.4, H&H 13.2 and 40.4 and platelet count 232K Glucose 90, BUN  12 and creatinine 0.60 Serum sodium 140, potassium 4.7, chloride 102, CO2 29 Serum calcium 9.4 Bilirubin 0.6, AP 64, AST 19, ALT 12, total protein 7.2 and albumin 4.6  Cholesterol 158 Triglycerides 48 HDL 77 and LDL 71 A1c 5.6.   Assessment:  #1.  Recent hematochezia.  She does not have any alarm symptoms.  Rectal examination reveals guaiac-negative stool.  She has history of hemorrhoids and underwent banding few years ago.  Limited records in epic also mention colonic polyp  but patient has no recollection.  Need to review old records and determine the timing of her next colonoscopy.  #2.  Low weight/low BMI.  This is possibly familial trait as all of her siblings are thin.  No recent change in her weight.  Therefore will not pursue further work-up. Marland Kitchen  Recommendations  Medication list updated. Request colonoscopy records from Dr. Harrell Lark office. Patient will keep symptom diary.  If she has another episode of hematochezia she will call office. Further recommendations to follow once colonoscopy records reviewed.

## 2019-03-13 ENCOUNTER — Other Ambulatory Visit: Payer: Self-pay

## 2019-03-13 ENCOUNTER — Ambulatory Visit: Payer: No Typology Code available for payment source | Admitting: Orthopaedic Surgery

## 2019-03-13 ENCOUNTER — Encounter: Payer: Self-pay | Admitting: Orthopaedic Surgery

## 2019-03-13 VITALS — BP 107/66 | HR 74 | Ht 61.0 in | Wt 104.0 lb

## 2019-03-13 DIAGNOSIS — G8929 Other chronic pain: Secondary | ICD-10-CM | POA: Diagnosis not present

## 2019-03-13 DIAGNOSIS — M25511 Pain in right shoulder: Secondary | ICD-10-CM

## 2019-03-13 MED ORDER — MELOXICAM 7.5 MG PO TABS: ORAL_TABLET | ORAL | 2 refills | Status: DC

## 2019-03-13 NOTE — Progress Notes (Signed)
Patient [name], female DOB:[birth year], 54 y.o. XFG:[id number]  Chief Complaint  Patient presents with  . Shoulder Pain    right     HPI  Diana Taylor is a 54 y.o. female who has chronic pain of the right shoulder.  She could not take the Naprosyn.  I will call in Mobic 7.5 bid.  She has no new trauma. She has some days with no pain. She has more pain in extension.  She has no redness, no numbness.   Body mass index is 19.65 kg/m.  ROS  Review of Systems  Constitutional: Positive for activity change.  Musculoskeletal: Positive for arthralgias and back pain.  Allergic/Immunologic: Positive for environmental allergies and food allergies.  All other systems reviewed and are negative.   All other systems reviewed and are negative.  The following is a summary of the past history medically, past history surgically, known current medicines, social history and family history.  This information is gathered electronically by the computer from prior information and documentation.  I review this each visit and have found including this information at this point in the chart is beneficial and informative.    Past Medical History:  Diagnosis Date  . Allergy   . Colon polyp 09/02/2017  . GERD (gastroesophageal reflux disease)   . Vitamin D deficiency    corrected 2018    History reviewed. No pertinent surgical history.  Family History  Problem Relation Age of Onset  . Stroke Mother 86       CVA  . Hypertension Mother   . COPD Father   . Nephrolithiasis Sister   . COPD Brother   . Healthy Daughter     Social History Social History   Tobacco Use  . Smoking status: Never Smoker  . Smokeless tobacco: Never Used  Substance Use Topics  . Alcohol use: No  . Drug use: No    No Known Allergies  Current Outpatient Medications  Medication Sig Dispense Refill  . cholecalciferol (VITAMIN D) 1000 units tablet Take 1,000 Units by mouth daily.    . meloxicam (MOBIC)  7.5 MG tablet Take one tablet twice a day after eating. 60 tablet 2   No current facility-administered medications for this visit.     Physical Exam  Blood pressure 107/66, pulse 74, height 5\' 1"  (1.549 m), weight 104 lb (47.2 kg), last menstrual period 02/01/2018.  Constitutional: overall normal hygiene, normal nutrition, well developed, normal grooming, normal body habitus. Assistive device:none  Musculoskeletal: gait and station Limp none, muscle tone and strength are normal, no tremors or atrophy is present.  .  Neurological: coordination overall normal.  Deep tendon reflex/nerve stretch intact.  Sensation normal.  Cranial nerves II-XII intact.   Skin:   Normal overall no scars, lesions, ulcers or rashes. No psoriasis.  Psychiatric: Alert and oriented x 3.  Recent memory intact, remote memory unclear.  Normal mood and affect. Well groomed.  Good eye contact.  Cardiovascular: overall no swelling, no varicosities, no edema bilaterally, normal temperatures of the legs and arms, no clubbing, cyanosis and good capillary refill.  Lymphatic: palpation is normal.  Right shoulder has full motion but pain in extension. NV intact.  Grips normal.  All other systems reviewed and are negative   The patient has been educated about the nature of the problem(s) and counseled on treatment options.  The patient appeared to understand what I have discussed and is in agreement with it.  Encounter Diagnosis  Name Primary?  14/12/2017  Chronic right shoulder pain Yes    PLAN Call if any problems.  Precautions discussed.  Change to Mobic.  It was called in.  Return to clinic 2 months   Electronically Signed Sanjuana Kava, MD 1/19/20219:32 AM

## 2019-05-10 ENCOUNTER — Ambulatory Visit: Payer: No Typology Code available for payment source | Admitting: Orthopaedic Surgery

## 2019-05-24 ENCOUNTER — Encounter (INDEPENDENT_AMBULATORY_CARE_PROVIDER_SITE_OTHER): Payer: Self-pay | Admitting: *Deleted

## 2019-05-24 ENCOUNTER — Encounter (INDEPENDENT_AMBULATORY_CARE_PROVIDER_SITE_OTHER): Payer: Self-pay

## 2019-05-28 ENCOUNTER — Other Ambulatory Visit (INDEPENDENT_AMBULATORY_CARE_PROVIDER_SITE_OTHER): Payer: Self-pay | Admitting: *Deleted

## 2019-05-28 ENCOUNTER — Encounter (INDEPENDENT_AMBULATORY_CARE_PROVIDER_SITE_OTHER): Payer: Self-pay | Admitting: *Deleted

## 2019-05-28 DIAGNOSIS — K921 Melena: Secondary | ICD-10-CM

## 2019-05-29 ENCOUNTER — Encounter (INDEPENDENT_AMBULATORY_CARE_PROVIDER_SITE_OTHER): Payer: Self-pay | Admitting: *Deleted

## 2019-06-08 ENCOUNTER — Telehealth (INDEPENDENT_AMBULATORY_CARE_PROVIDER_SITE_OTHER): Payer: Self-pay | Admitting: *Deleted

## 2019-06-08 MED ORDER — PLENVU 140 G PO SOLR: 1.0000 | Freq: Once | ORAL | 0 refills | Status: AC

## 2019-06-08 NOTE — Telephone Encounter (Signed)
Patient needs Plenvu (copay card) ° °

## 2019-06-20 ENCOUNTER — Other Ambulatory Visit (INDEPENDENT_AMBULATORY_CARE_PROVIDER_SITE_OTHER): Payer: Self-pay | Admitting: *Deleted

## 2019-06-25 ENCOUNTER — Other Ambulatory Visit (HOSPITAL_COMMUNITY)
Admission: RE | Admit: 2019-06-25 | Discharge: 2019-06-25 | Disposition: A | Discharge status: Home / Self Care | Payer: No Typology Code available for payment source | Source: Ambulatory Visit | Attending: Internal Medicine | Admitting: Internal Medicine

## 2019-06-25 ENCOUNTER — Other Ambulatory Visit: Payer: Self-pay

## 2019-06-26 ENCOUNTER — Other Ambulatory Visit (HOSPITAL_COMMUNITY)
Admission: RE | Admit: 2019-06-26 | Discharge: 2019-06-26 | Disposition: A | Discharge status: Home / Self Care | Payer: No Typology Code available for payment source | Source: Ambulatory Visit | Attending: Internal Medicine | Admitting: Internal Medicine

## 2019-06-26 ENCOUNTER — Other Ambulatory Visit (HOSPITAL_COMMUNITY): Payer: No Typology Code available for payment source

## 2019-06-26 ENCOUNTER — Other Ambulatory Visit: Payer: Self-pay

## 2019-06-26 DIAGNOSIS — Z01812 Encounter for preprocedural laboratory examination: Secondary | ICD-10-CM | POA: Insufficient documentation

## 2019-06-26 DIAGNOSIS — Z20822 Contact with and (suspected) exposure to covid-19: Secondary | ICD-10-CM | POA: Diagnosis not present

## 2019-06-27 ENCOUNTER — Ambulatory Visit (HOSPITAL_COMMUNITY)
Admission: RE | Admit: 2019-06-27 | Discharge: 2019-06-27 | Disposition: A | Discharge status: Home / Self Care | Payer: No Typology Code available for payment source | Attending: Internal Medicine | Admitting: Internal Medicine

## 2019-06-27 ENCOUNTER — Encounter (HOSPITAL_COMMUNITY)
Admission: RE | Disposition: A | Discharge status: Home / Self Care | Payer: Self-pay | Source: Home / Self Care | Attending: Internal Medicine

## 2019-06-27 ENCOUNTER — Encounter (HOSPITAL_COMMUNITY): Payer: Self-pay | Admitting: Internal Medicine

## 2019-06-27 ENCOUNTER — Other Ambulatory Visit: Payer: Self-pay

## 2019-06-27 DIAGNOSIS — E559 Vitamin D deficiency, unspecified: Secondary | ICD-10-CM | POA: Insufficient documentation

## 2019-06-27 DIAGNOSIS — K6289 Other specified diseases of anus and rectum: Secondary | ICD-10-CM

## 2019-06-27 DIAGNOSIS — K625 Hemorrhage of anus and rectum: Secondary | ICD-10-CM | POA: Insufficient documentation

## 2019-06-27 DIAGNOSIS — K921 Melena: Secondary | ICD-10-CM

## 2019-06-27 DIAGNOSIS — Z8719 Personal history of other diseases of the digestive system: Secondary | ICD-10-CM | POA: Diagnosis not present

## 2019-06-27 DIAGNOSIS — K644 Residual hemorrhoidal skin tags: Secondary | ICD-10-CM | POA: Insufficient documentation

## 2019-06-27 HISTORY — PX: COLONOSCOPY: SHX5424

## 2019-06-27 LAB — SARS CORONAVIRUS 2 (TAT 6-24 HRS): SARS Coronavirus 2: NEGATIVE

## 2019-06-27 SURGERY — COLONOSCOPY
Anesthesia: Moderate Sedation

## 2019-06-27 MED ORDER — MIDAZOLAM HCL 5 MG/5ML IJ SOLN
INTRAMUSCULAR | Status: DC | PRN
  Administered 2019-06-27: 1 mg via INTRAVENOUS
  Administered 2019-06-27 (×2): 2 mg via INTRAVENOUS

## 2019-06-27 MED ORDER — SODIUM CHLORIDE 0.9 % IV SOLN
INTRAVENOUS | Status: DC
  Administered 2019-06-27: 1000 mL via INTRAVENOUS

## 2019-06-27 MED ORDER — MEPERIDINE HCL 50 MG/ML IJ SOLN
INTRAMUSCULAR | Status: AC
  Filled 2019-06-27: qty 1

## 2019-06-27 MED ORDER — MIDAZOLAM HCL 5 MG/5ML IJ SOLN
INTRAMUSCULAR | Status: AC
  Filled 2019-06-27: qty 10

## 2019-06-27 MED ORDER — STERILE WATER FOR IRRIGATION IR SOLN
Status: DC | PRN
  Administered 2019-06-27: 15:00:00 1.5 mL

## 2019-06-27 MED ORDER — MEPERIDINE HCL 50 MG/ML IJ SOLN
INTRAMUSCULAR | Status: DC | PRN
  Administered 2019-06-27: 10 mg via INTRAVENOUS
  Administered 2019-06-27: 20 mg via INTRAVENOUS

## 2019-06-27 NOTE — Op Note (Signed)
[facility] Patient Name: Diana Taylor Procedure Date: 06/27/2019 2:20 PM MRN: [id number] Date of Birth: 12/05/1965 Attending MD: Lionel December , MD CSN: [id number] Age: 54 Admit Type: Outpatient Procedure:                Colonoscopy Indications:              Rectal bleeding Providers:                Lionel December, MD, Criselda Peaches. Patsy Lager, RN, Edythe Clarity, Technician Referring MD:             Catalina Pizza, MD Medicines:                Meperidine 30 mg IV, Midazolam 5 mg IV Complications:            No immediate complications. Estimated Blood Loss:     Estimated blood loss: none. Procedure:                Pre-Anesthesia Assessment:                           - Prior to the procedure, a History and Physical                            was performed, and patient medications and                            allergies were reviewed. The patient's tolerance of                            previous anesthesia was also reviewed. The risks                            and benefits of the procedure and the sedation                            options and risks were discussed with the patient.                            All questions were answered, and informed consent                            was obtained. Prior Anticoagulants: The patient has                            taken no previous anticoagulant or antiplatelet                            agents. ASA Grade Assessment: I - A normal, healthy                            patient. After reviewing the risks and benefits,  the patient was deemed in satisfactory condition to                            undergo the procedure.                           After obtaining informed consent, the colonoscope                            was passed under direct vision. Throughout the                            procedure, the patient's blood pressure, pulse, and                            oxygen saturations were  monitored continuously. The                            [id number]-[id number] ([id number]) scope was introduced through                            the anus and advanced to the the terminal ileum,                            with identification of the appendiceal orifice and                            IC valve. The colonoscopy was technically difficult                            and complex due to a tortuous colon. Successful                            completion of the procedure was aided by                            withdrawing the scope and replacing with the                            'babyscope'. The patient tolerated the procedure                            well. The quality of the bowel preparation was good                            except the cecum was fair. The terminal ileum,                            ileocecal valve, appendiceal orifice, and rectum                            were photographed. Scope In: 2:45:44 PM Scope Out: 3:09:54 PM Scope Withdrawal Time: 0 hours 5 minutes 12 seconds  Total Procedure Duration: 0  hours 24 minutes 10 seconds  Findings:      The perianal and digital rectal examinations were normal.      The terminal ileum appeared normal.      The colon (entire examined portion) appeared normal.      A medium post banding scar was found in the distal rectum.      External hemorrhoids were found during retroflexion. The hemorrhoids       were small. Impression:               - The examined portion of the ileum was normal.                           - The entire examined colon is normal.                           - Scar in the distal rectum.                           - External hemorrhoids.                           - No specimens collected. Moderate Sedation:      Moderate (conscious) sedation was administered by the endoscopy nurse       and supervised by the endoscopist. The following parameters were       monitored: oxygen saturation, heart rate, blood pressure, CO2        capnography and response to care. Total physician intraservice time was       28 minutes. Recommendation:           - Patient has a contact number available for                            emergencies. The signs and symptoms of potential                            delayed complications were discussed with the                            patient. Return to normal activities tomorrow.                            Written discharge instructions were provided to the                            patient.                           - High fiber diet today.                           - Continue present medications.                           - Repeat colonoscopy in 10 years for screening  purposes. Procedure Code(s):        --- Professional ---                           [phone], Colonoscopy, flexible; diagnostic, including                            collection of specimen(s) by brushing or washing,                            when performed (separate procedure)                           99153, Moderate sedation; each additional 15                            minutes intraservice time                           G0500, Moderate sedation services provided by the                            same physician or other qualified health care                            professional performing a gastrointestinal                            endoscopic service that sedation supports,                            requiring the presence of an independent trained                            observer to assist in the monitoring of the                            patient's level of consciousness and physiological                            status; initial 15 minutes of intra-service time;                            patient age 64 years or older (additional time may                            be reported with [phone], as appropriate) Diagnosis Code(s):        --- Professional ---                            K64.4, Residual hemorrhoidal skin tags                           K62.89, Other specified diseases of anus and rectum  K62.5, Hemorrhage of anus and rectum CPT copyright 2019 American Medical Association. All rights reserved. The codes documented in this report are preliminary and upon coder review may  be revised to meet current compliance requirements. Lionel DecemberNajeeb Iantha Titsworth, MD Lionel DecemberNajeeb Tikesha Mort, MD 06/27/2019 3:18:21 PM This report has been signed electronically. Number of Addenda: 0

## 2019-06-27 NOTE — Discharge Instructions (Signed)
Resume usual medications and high-fiber diet. No driving for 24 hours. Next screening exam in 10 years.   Colonoscopy, Adult, Care After This sheet gives you information about how to care for yourself after your procedure. Your doctor may also give you more specific instructions. If you have problems or questions, call your doctor. What can I expect after the procedure? After the procedure, it is common to have:  A small amount of blood in your poop (stool) for 24 hours.  Some gas.  Mild cramping or bloating in your belly (abdomen). Follow these instructions at home: Eating and drinking   Drink enough fluid to keep your pee (urine) pale yellow.  Follow instructions from your doctor about what you cannot eat or drink.  Return to your normal diet as told by your doctor. Avoid heavy or fried foods that are hard to digest. Activity  Rest as told by your doctor.  Do not sit for a long time without moving. Get up to take short walks every 1-2 hours. This is important. Ask for help if you feel weak or unsteady.  Return to your normal activities as told by your doctor. Ask your doctor what activities are safe for you. To help cramping and bloating:   Try walking around.  Put heat on your belly as told by your doctor. Use the heat source that your doctor recommends, such as a moist heat pack or a heating pad. ? Put a towel between your skin and the heat source. ? Leave the heat on for 20-30 minutes. ? Remove the heat if your skin turns bright red. This is very important if you are unable to feel pain, heat, or cold. You may have a greater risk of getting burned. General instructions  For the first 24 hours after the procedure: ? Do not drive or use machinery. ? Do not sign important documents. ? Do not drink alcohol. ? Do your daily activities more slowly than normal. ? Eat foods that are soft and easy to digest.  Take over-the-counter or prescription medicines only as told by  your doctor.  Keep all follow-up visits as told by your doctor. This is important. Contact a doctor if:  You have blood in your poop 2-3 days after the procedure. Get help right away if:  You have more than a small amount of blood in your poop.  You see large clumps of tissue (blood clots) in your poop.  Your belly is swollen.  You feel like you may vomit (nauseous).  You vomit.  You have a fever.  You have belly pain that gets worse, and medicine does not help your pain. Summary  After the procedure, it is common to have a small amount of blood in your poop. You may also have mild cramping and bloating in your belly.  For the first 24 hours after the procedure, do not drive or use machinery, do not sign important documents, and do not drink alcohol.  Get help right away if you have a lot of blood in your poop, feel like you may vomit, have a fever, or have more belly pain. This information is not intended to replace advice given to you by your health care provider. Make sure you discuss any questions you have with your health care provider. Document Revised: 09/04/2018 Document Reviewed: 09/04/2018 Elsevier Patient Education  2020 Elsevier Inc.   High-Fiber Diet Fiber, also called dietary fiber, is a type of carbohydrate that is found in fruits, vegetables, whole  grains, and beans. A high-fiber diet can have many health benefits. Your health care provider may recommend a high-fiber diet to help:  Prevent constipation. Fiber can make your bowel movements more regular.  Lower your cholesterol.  Relieve the following conditions: ? Swelling of veins in the anus (hemorrhoids). ? Swelling and irritation (inflammation) of specific areas of the digestive tract (uncomplicated diverticulosis). ? A problem of the large intestine (colon) that sometimes causes pain and diarrhea (irritable bowel syndrome, IBS).  Prevent overeating as part of a weight-loss plan.  Prevent heart  disease, type 2 diabetes, and certain cancers. What is my plan? The recommended daily fiber intake in grams (g) includes:  38 g for men age 47 or younger.  30 g for men over age 52.  99 g for women age 48 or younger.  21 g for women over age 70. You can get the recommended daily intake of dietary fiber by:  Eating a variety of fruits, vegetables, grains, and beans.  Taking a fiber supplement, if it is not possible to get enough fiber through your diet. What do I need to know about a high-fiber diet?  It is better to get fiber through food sources rather than from fiber supplements. There is not a lot of research about how effective supplements are.  Always check the fiber content on the nutrition facts label of any prepackaged food. Look for foods that contain 5 g of fiber or more per serving.  Talk with a diet and nutrition specialist (dietitian) if you have questions about specific foods that are recommended or not recommended for your medical condition, especially if those foods are not listed below.  Gradually increase how much fiber you consume. If you increase your intake of dietary fiber too quickly, you may have bloating, cramping, or gas.  Drink plenty of water. Water helps you to digest fiber. What are tips for following this plan?  Eat a wide variety of high-fiber foods.  Make sure that half of the grains that you eat each day are whole grains.  Eat breads and cereals that are made with whole-grain flour instead of refined flour or white flour.  Eat brown rice, bulgur wheat, or millet instead of white rice.  Start the day with a breakfast that is high in fiber, such as a cereal that contains 5 g of fiber or more per serving.  Use beans in place of meat in soups, salads, and pasta dishes.  Eat high-fiber snacks, such as berries, raw vegetables, nuts, and popcorn.  Choose whole fruits and vegetables instead of processed forms like juice or sauce. What foods can I  eat?  Fruits Berries. Pears. Apples. Oranges. Avocado. Prunes and raisins. Dried figs. Vegetables Sweet potatoes. Spinach. Kale. Artichokes. Cabbage. Broccoli. Cauliflower. Green peas. Carrots. Squash. Grains Whole-grain breads. Multigrain cereal. Oats and oatmeal. Brown rice. Barley. Bulgur wheat. [place]. Quinoa. Bran muffins. Popcorn. Rye wafer crackers. Meats and other proteins Navy, kidney, and pinto beans. Soybeans. Split peas. Lentils. Nuts and seeds. Dairy Fiber-fortified yogurt. Beverages Fiber-fortified soy milk. Fiber-fortified orange juice. Other foods Fiber bars. The items listed above may not be a complete list of recommended foods and beverages. Contact a dietitian for more options. What foods are not recommended? Fruits Fruit juice. Cooked, strained fruit. Vegetables Fried potatoes. Canned vegetables. Well-cooked vegetables. Grains White bread. Pasta made with refined flour. White rice. Meats and other proteins Fatty cuts of meat. Fried chicken or fried fish. Dairy Milk. Yogurt. Cream cheese. Sour cream. Fats  and oils Butters. Beverages Soft drinks. Other foods Cakes and pastries. The items listed above may not be a complete list of foods and beverages to avoid. Contact a dietitian for more information. Summary  Fiber is a type of carbohydrate. It is found in fruits, vegetables, whole grains, and beans.  There are many health benefits of eating a high-fiber diet, such as preventing constipation, lowering blood cholesterol, helping with weight loss, and reducing your risk of heart disease, diabetes, and certain cancers.  Gradually increase your intake of fiber. Increasing too fast can result in cramping, bloating, and gas. Drink plenty of water while you increase your fiber.  The best sources of fiber include whole fruits and vegetables, whole grains, nuts, seeds, and beans. This information is not intended to replace advice given to you by your health care  provider. Make sure you discuss any questions you have with your health care provider. Document Revised: 12/13/2016 Document Reviewed: 12/13/2016 Elsevier Patient Education  2020 [employer].

## 2019-06-27 NOTE — H&P (Signed)
Diana Taylor is an 54 y.o. female.   Chief Complaint: Patient is here for colonoscopy. HPI: Patient is 54 year old female was seen in the office about 4 months ago with single episode of rectal bleeding.  And she was seen in the office her stool was guaiac negative.  She does not have history of diarrhea and/or constipation.  She has a history of hemorrhoids.  Her last colonoscopy was in [place] in October 2016 and she was noted to have internal hemorrhoids.  Dr. Harrell Taylor noted her preparation was suboptimal and he recommended follow-up exam in 3 years.  Patient has not had any exam since then. Family history is negative for CRC.  Past Medical History:  Diagnosis Date  . Allergy   . Colon polyp 09/02/2017  . GERD (gastroesophageal reflux disease)   . Vitamin D deficiency    corrected 2018    Past Surgical History:  Procedure Laterality Date  . EYE SURGERY Bilateral    cataract  . HEMORRHOID BANDING      Family History  Problem Relation Age of Onset  . Stroke Mother 66       CVA  . Hypertension Mother   . COPD Father   . Nephrolithiasis Sister   . COPD Brother   . Healthy Daughter    Social History:  reports that she has never smoked. She has never used smokeless tobacco. She reports that she does not drink alcohol or use drugs.  Allergies:  Allergies  Allergen Reactions  . Latex Itching  . Other Itching    Hand Sanitizer   . Rubbing Alcohol [Alcohol] Itching    Medications Prior to Admission  Medication Sig Dispense Refill  . CALCIUM PO Take 1 tablet by mouth once a week.    . Cholecalciferol (VITAMIN D3 PO) Take 1 capsule by mouth 4 (four) times a week.    . Salicylic Acid (CORN & CALLUS REMOVER EX) Apply 1 application topically daily as needed (corns).    . triamcinolone cream (KENALOG) 0.1 % Apply 1 application topically daily as needed (itching).     . meloxicam (MOBIC) 7.5 MG tablet Take one tablet twice a day after eating. (Patient not  taking: Reported on 06/26/2019) 60 tablet 2    Results for orders placed or performed during the hospital encounter of 06/26/19 (from the past 48 hour(s))  SARS CORONAVIRUS 2 (TAT 6-24 HRS) Nasopharyngeal Nasopharyngeal Swab     Status: None   Collection Time: 06/26/19  2:30 PM   Specimen: Nasopharyngeal Swab  Result Value Ref Range   SARS Coronavirus 2 NEGATIVE NEGATIVE    Comment: (NOTE) SARS-CoV-2 target nucleic acids are NOT DETECTED. The SARS-CoV-2 RNA is generally detectable in upper and lower respiratory specimens during the acute phase of infection. Negative results do not preclude SARS-CoV-2 infection, do not rule out co-infections with other pathogens, and should not be used as the sole basis for treatment or other patient management decisions. Negative results must be combined with clinical observations, patient history, and epidemiological information. The expected result is Negative. Fact Sheet for Patients: SugarRoll.be Fact Sheet for Healthcare Providers: https://www.woods-mathews.com/ This test is not yet approved or cleared by the [place] FDA and  has been authorized for detection and/or diagnosis of SARS-CoV-2 by FDA under an Emergency Use Authorization (EUA). This EUA will remain  in effect (meaning this test can be used) for the duration of the COVID-19 declaration under Section 56 4(b)(1) of the Act, 21 U.S.C. section  360bbb-3(b)(1), unless the authorization is terminated or revoked sooner. Performed at [facility], [address], [place], [place] [address]    No results found.  Review of Systems  Blood pressure 114/78, pulse 65, temperature 97.8 F (36.6 C), temperature source Oral, resp. rate 13, height 5\' 1"  (1.549 m), weight 46.3 kg, last menstrual period 02/01/2018, SpO2 100 %. Physical Exam  Constitutional:  Thin female in NAD.  HENT:  Mouth/Throat: Oropharynx is clear and moist.  Eyes:  Conjunctivae are normal. No scleral icterus.  Neck: No thyromegaly present.  Cardiovascular: Normal rate, regular rhythm and normal heart sounds.  No murmur heard. Respiratory: Effort normal and breath sounds normal.  GI:  Abdomen is flat and soft with no organomegaly or masses.  Musculoskeletal:        General: No edema.  Lymphadenopathy:    She has no cervical adenopathy.  Neurological: She is alert.  Skin: Skin is warm and dry.     Assessment/Plan Rectal bleeding. Diagnostic colonoscopy.  14/12/2017, MD 06/27/2019, 2:37 PM

## 2020-01-24 ENCOUNTER — Other Ambulatory Visit (HOSPITAL_COMMUNITY): Payer: Self-pay | Admitting: Internal Medicine

## 2020-01-24 DIAGNOSIS — Z1231 Encounter for screening mammogram for malignant neoplasm of breast: Secondary | ICD-10-CM

## 2020-02-20 ENCOUNTER — Ambulatory Visit (HOSPITAL_COMMUNITY)
Admission: RE | Admit: 2020-02-20 | Discharge: 2020-02-20 | Disposition: A | Discharge status: Home / Self Care | Payer: No Typology Code available for payment source | Source: Ambulatory Visit | Attending: Internal Medicine | Admitting: Internal Medicine

## 2020-02-20 ENCOUNTER — Other Ambulatory Visit: Payer: Self-pay

## 2020-02-20 DIAGNOSIS — Z1231 Encounter for screening mammogram for malignant neoplasm of breast: Secondary | ICD-10-CM | POA: Diagnosis present

## 2021-01-29 ENCOUNTER — Other Ambulatory Visit (HOSPITAL_COMMUNITY): Payer: Self-pay | Admitting: Internal Medicine

## 2021-01-29 ENCOUNTER — Other Ambulatory Visit: Payer: Self-pay

## 2021-01-29 ENCOUNTER — Encounter: Payer: Self-pay | Admitting: Adult Health

## 2021-01-29 ENCOUNTER — Other Ambulatory Visit (HOSPITAL_COMMUNITY)
Admission: RE | Admit: 2021-01-29 | Discharge: 2021-01-29 | Disposition: A | Discharge status: Home / Self Care | Payer: No Typology Code available for payment source | Source: Ambulatory Visit | Attending: Adult Health | Admitting: Adult Health

## 2021-01-29 ENCOUNTER — Ambulatory Visit (INDEPENDENT_AMBULATORY_CARE_PROVIDER_SITE_OTHER): Payer: No Typology Code available for payment source | Admitting: Adult Health

## 2021-01-29 VITALS — BP 112/69 | HR 70 | Ht 61.0 in | Wt 103.0 lb

## 2021-01-29 DIAGNOSIS — Z1211 Encounter for screening for malignant neoplasm of colon: Secondary | ICD-10-CM | POA: Diagnosis not present

## 2021-01-29 DIAGNOSIS — Z1231 Encounter for screening mammogram for malignant neoplasm of breast: Secondary | ICD-10-CM

## 2021-01-29 DIAGNOSIS — Z01419 Encounter for gynecological examination (general) (routine) without abnormal findings: Secondary | ICD-10-CM | POA: Diagnosis not present

## 2021-01-29 LAB — HEMOCCULT GUIAC POC 1CARD (OFFICE): Fecal Occult Blood, POC: NEGATIVE

## 2021-01-29 NOTE — Progress Notes (Signed)
Patient ID: DEVOTA VIRUET, female   DOB: 08-19-1965, 55 y.o.   MRN: [id number] History of Present Illness: Lawson is a 55 year old Asian female, married, PM in for pelvic and pap, had physical with Dr Margo Aye 12/18/20. PCP is Dr Margo Aye.   Current Medications, Allergies, Past Medical History, Past Surgical History, Family History and Social History were reviewed in [employer] record.     Review of Systems: Patient denies any headaches, hearing loss, fatigue, blurred vision, shortness of breath, chest pain, abdominal pain, problems with bowel movements, urination, or intercourse.(Not very active). No joint pain or mood swings.     Physical Exam:BP 112/69 (BP Location: Left Arm, Patient Position: Sitting, Cuff Size: Normal)   Pulse 70   Ht 5\' 1"  (1.549 m)   Wt 103 lb (46.7 kg)   LMP 02/01/2018   BMI 19.46 kg/m   General:  Well developed, well nourished, no acute distress Skin:  Warm and dry Neck:  Midline trachea, normal thyroid, good ROM, no lymphadenopathy Lungs; Clear to auscultation bilaterally Breast:  No dominant palpable mass, retraction, or nipple discharge Cardiovascular: Regular rate and rhythm Abdomen:  Soft, non tender, no hepatosplenomegaly Pelvic:  External genitalia is normal in appearance, no lesions.  The vagina is pale with loss of rugae. Urethra has no lesions or masses. The cervix is smooth, pap with HR HPV genotyping performed.   Uterus is felt to be normal size, shape, and contour.  No adnexal masses or tenderness noted.Bladder is non tender, no masses felt. Rectal: Good sphincter tone, no polyps, + hemorrhoids felt.  Hemoccult negative. Extremities/musculoskeletal:  No swelling or varicosities noted, no clubbing or cyanosis Psych:  No mood changes, alert and cooperative,seems happy AA is 0 Fall risk is low Depression screen PHQ 2/9 01/29/2021  Decreased Interest 0  Down, Depressed, Hopeless 0  PHQ - 2 Score 0     GAD 7 : Generalized Anxiety  Score 01/29/2021  Nervous, Anxious, on Edge 0  Control/stop worrying 0  Worry too much - different things 0  Trouble relaxing 0  Restless 0  Easily annoyed or irritable 0  Afraid - awful might happen 0  Total GAD 7 Score 0   .  Upstream - 01/29/21 0957       Pregnancy Intention Screening   Does the patient want to become pregnant in the next year? No    Does the patient's partner want to become pregnant in the next year? No    Would the patient like to discuss contraceptive options today? No      Contraception Wrap Up   Current Method --   PM   End Method --   PM   Contraception Counseling Provided No             Examination chaperoned by 14/08/22 LPN  Impression and Plan: 1. Encounter for gynecological examination with Papanicolaou smear of cervix Pap sent Pap in 3 years if normal Physical with PCP Mammogram normal 02/20/20 Colonoscopy per Dr 02/22/20 with PCP  2. Encounter for screening fecal occult blood testing

## 2021-02-02 LAB — CYTOLOGY - PAP
Comment: NEGATIVE
Diagnosis: NEGATIVE
High risk HPV: NEGATIVE

## 2021-02-26 ENCOUNTER — Ambulatory Visit (HOSPITAL_COMMUNITY): Payer: No Typology Code available for payment source

## 2021-03-04 ENCOUNTER — Other Ambulatory Visit: Payer: Self-pay

## 2021-03-04 ENCOUNTER — Ambulatory Visit (HOSPITAL_COMMUNITY)
Admission: RE | Admit: 2021-03-04 | Discharge: 2021-03-04 | Disposition: A | Discharge status: Home / Self Care | Payer: No Typology Code available for payment source | Source: Ambulatory Visit | Attending: Internal Medicine | Admitting: Internal Medicine

## 2021-03-04 DIAGNOSIS — Z1231 Encounter for screening mammogram for malignant neoplasm of breast: Secondary | ICD-10-CM | POA: Diagnosis not present

## 2022-02-04 ENCOUNTER — Other Ambulatory Visit (HOSPITAL_COMMUNITY): Payer: Self-pay | Admitting: Internal Medicine

## 2022-02-04 DIAGNOSIS — Z1231 Encounter for screening mammogram for malignant neoplasm of breast: Secondary | ICD-10-CM

## 2022-02-23 ENCOUNTER — Other Ambulatory Visit (HOSPITAL_COMMUNITY): Payer: Self-pay | Admitting: Internal Medicine

## 2022-02-23 DIAGNOSIS — Z13820 Encounter for screening for osteoporosis: Secondary | ICD-10-CM

## 2022-04-22 ENCOUNTER — Ambulatory Visit (HOSPITAL_COMMUNITY)
Admission: RE | Admit: 2022-04-22 | Discharge: 2022-04-22 | Disposition: A | Discharge status: Home / Self Care | Payer: 59 | Source: Ambulatory Visit | Attending: Internal Medicine | Admitting: Internal Medicine

## 2022-04-22 ENCOUNTER — Encounter: Payer: Self-pay | Admitting: Radiology

## 2022-04-22 ENCOUNTER — Encounter (HOSPITAL_COMMUNITY): Payer: Self-pay

## 2022-04-22 DIAGNOSIS — M8589 Other specified disorders of bone density and structure, multiple sites: Secondary | ICD-10-CM | POA: Diagnosis not present

## 2022-04-22 DIAGNOSIS — Z1231 Encounter for screening mammogram for malignant neoplasm of breast: Secondary | ICD-10-CM | POA: Diagnosis not present

## 2022-04-22 DIAGNOSIS — Z13820 Encounter for screening for osteoporosis: Secondary | ICD-10-CM | POA: Insufficient documentation

## 2022-04-22 DIAGNOSIS — Z780 Asymptomatic menopausal state: Secondary | ICD-10-CM | POA: Diagnosis not present

## 2022-06-23 DIAGNOSIS — R7301 Impaired fasting glucose: Secondary | ICD-10-CM | POA: Diagnosis not present

## 2022-07-16 IMAGING — MG MM DIGITAL SCREENING BILAT W/ TOMO AND CAD
6 of 10 series · 6 of 30 positions shown · non-contrast
Comparison: Previous exam(s).

CLINICAL DATA: Screening.

EXAM:
DIGITAL SCREENING BILATERAL MAMMOGRAM WITH TOMOSYNTHESIS AND CAD
TECHNIQUE: Bilateral screening digital craniocaudal and mediolateral oblique
mammograms were obtained. Bilateral screening digital breast
tomosynthesis was performed. The images were evaluated with
computer-aided detection.

[R CC synth-2D (1 of 2)]
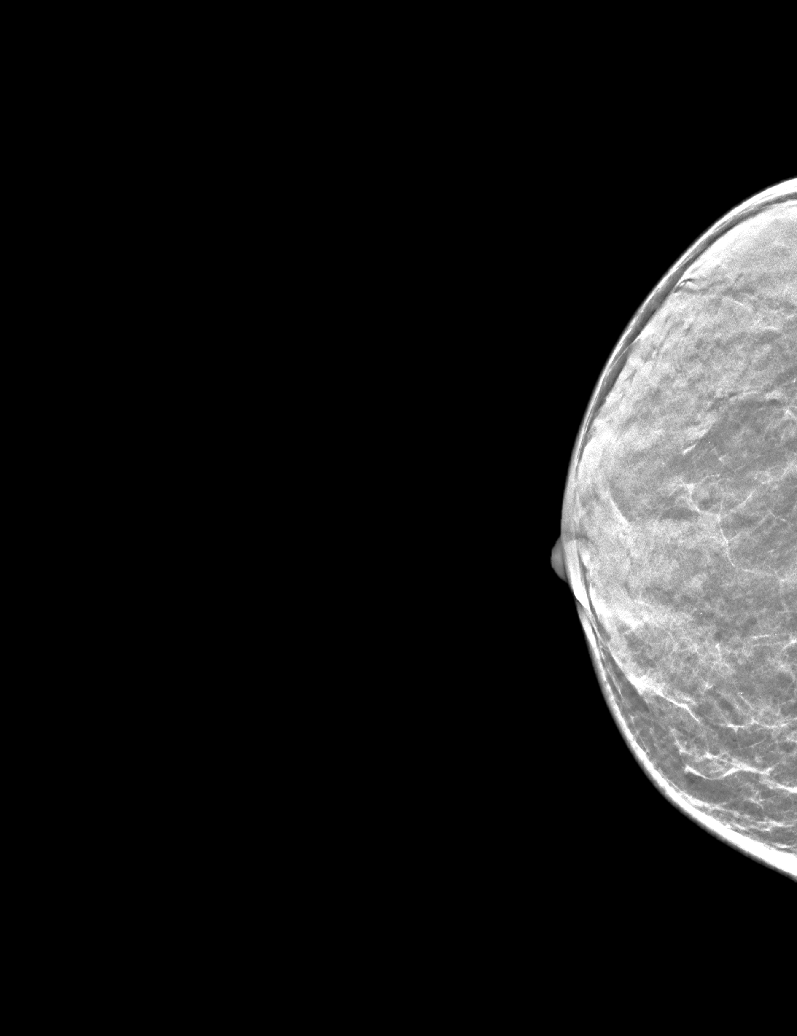

[L CC synth-2D]
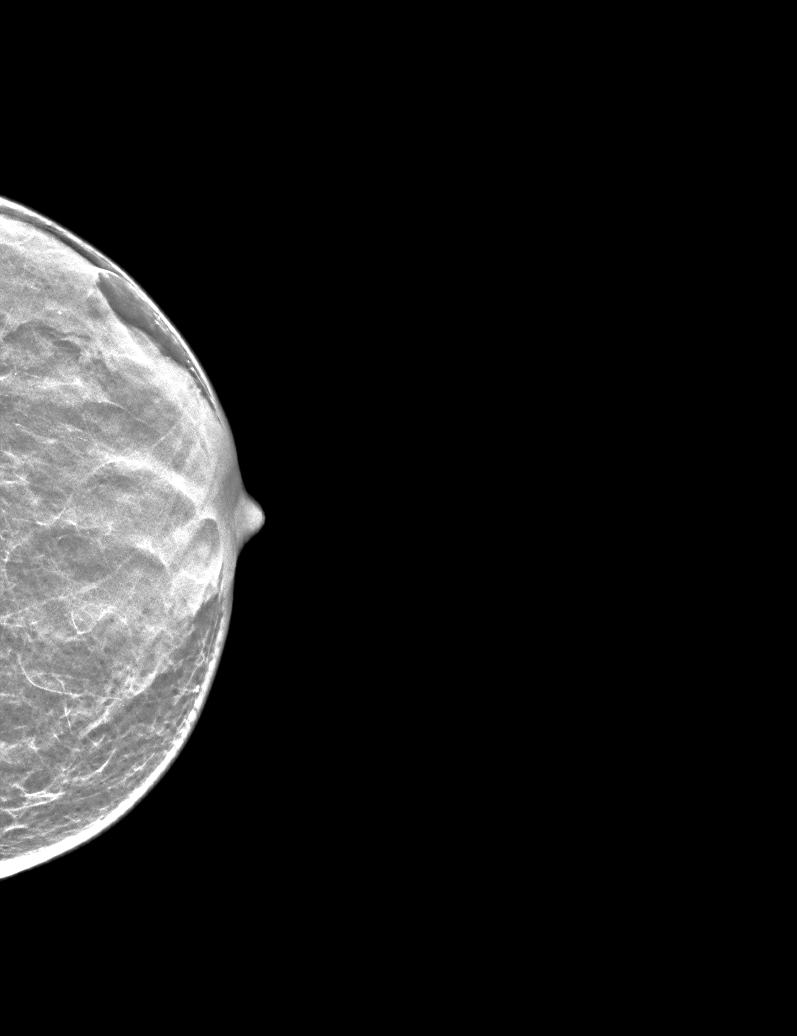

[R MLO synth-2D]
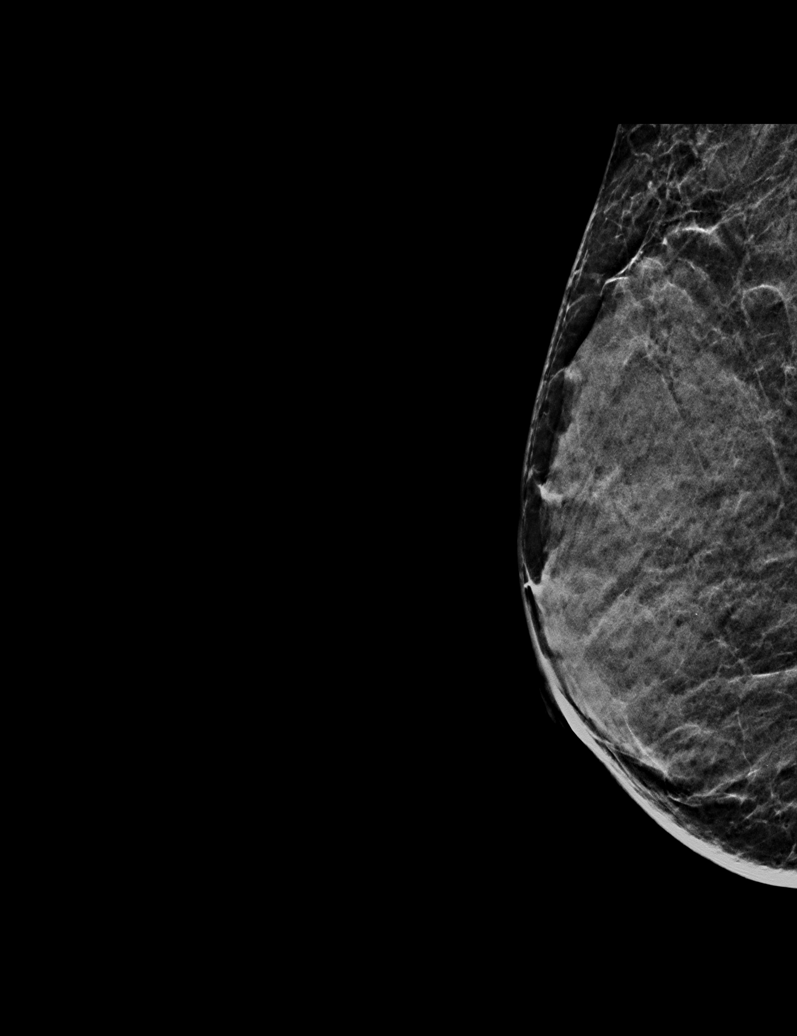

[L MLO synth-2D]
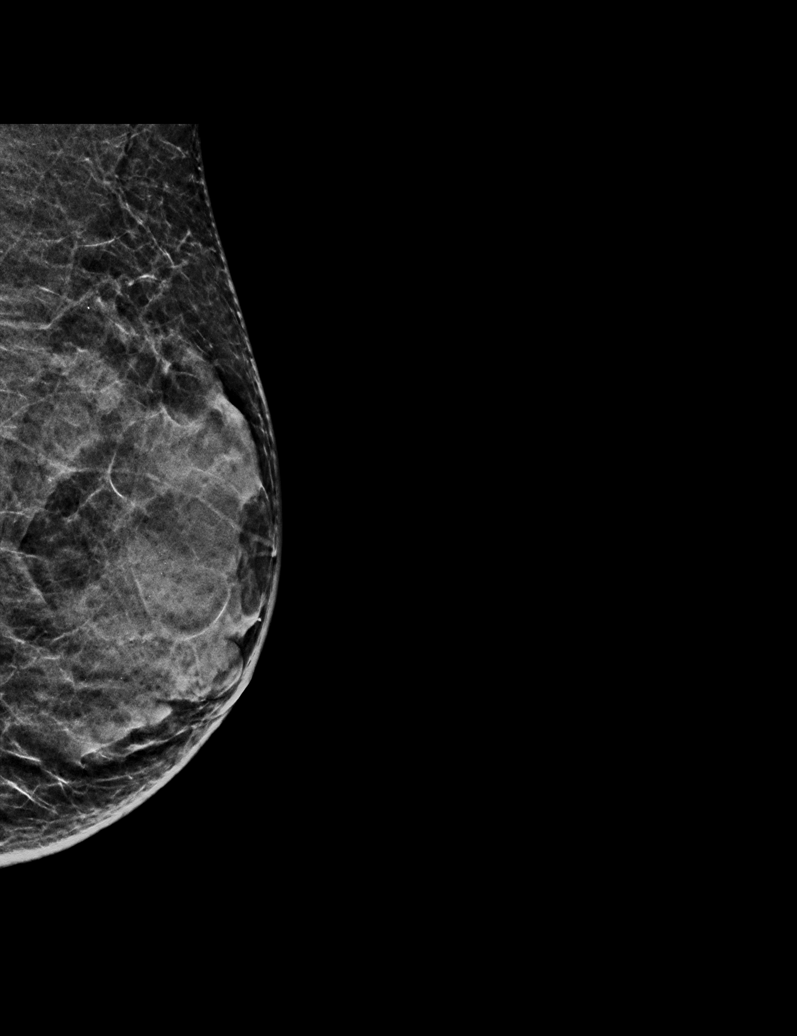

[R CC synth-2D (2 of 2)]
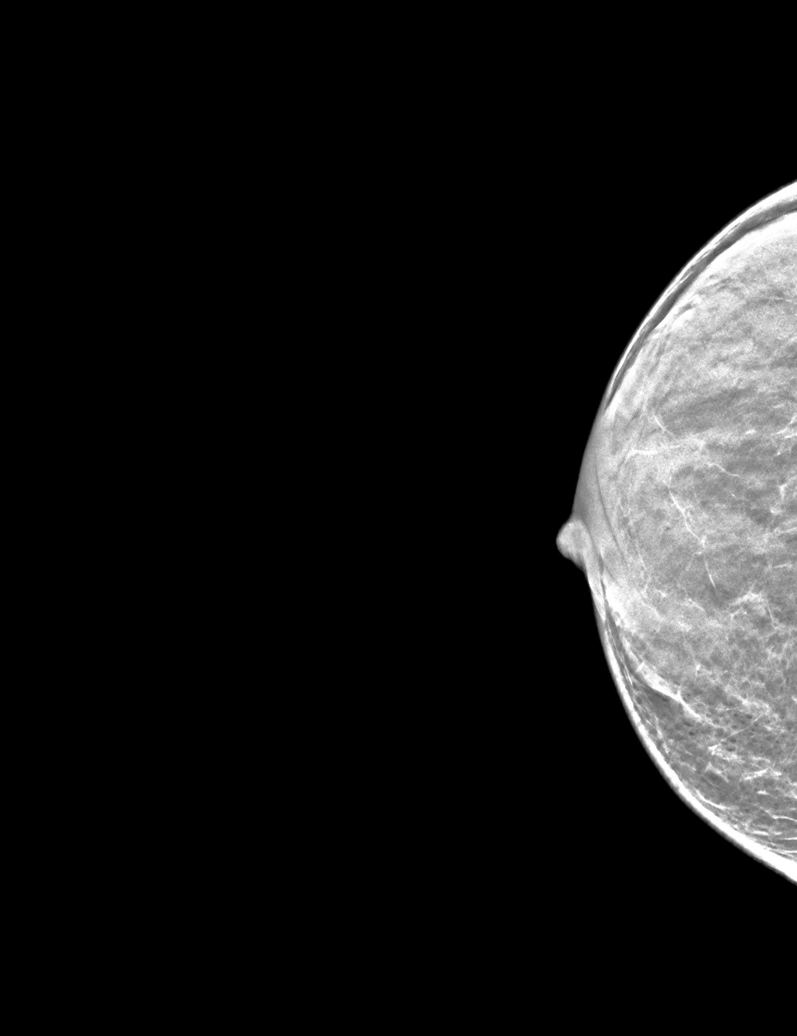

[L CC tomo · tomo slice 15/29.0]
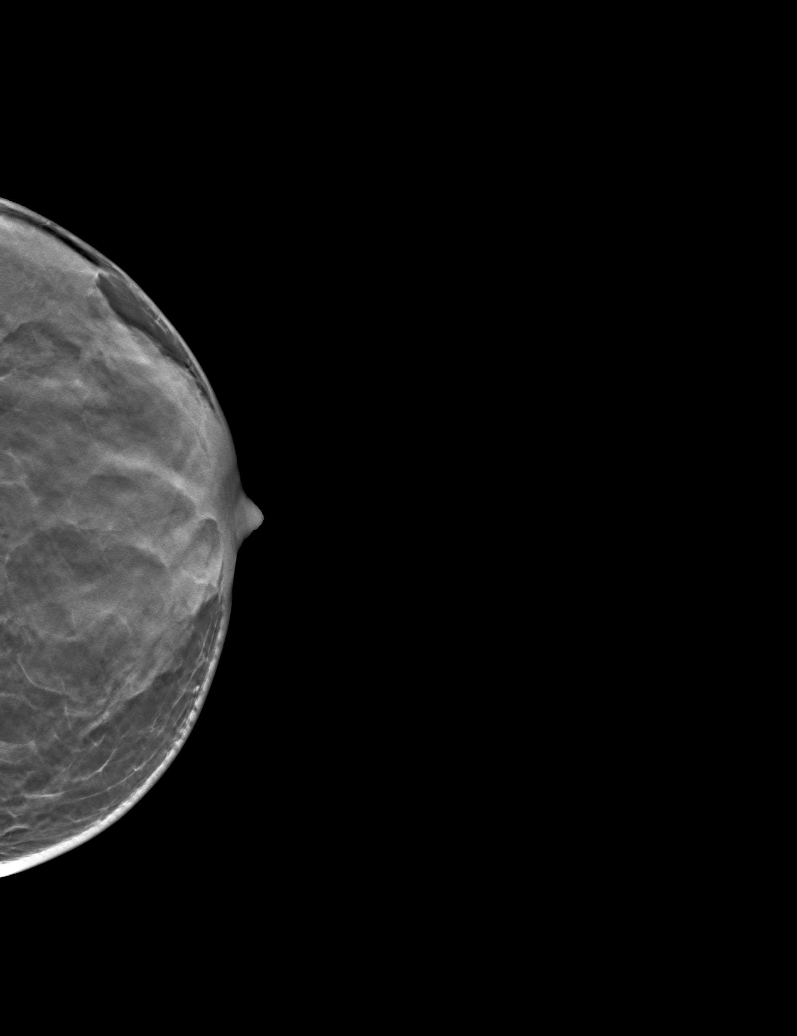

[6 of 30 positions shown; findings below may reference images not displayed]

ACR Breast Density Category d: The breast tissue is extremely dense,
which lowers the sensitivity of mammography
FINDINGS: There are no findings suspicious for malignancy.
IMPRESSION: No mammographic evidence of malignancy. A result letter of this
screening mammogram will be mailed directly to the patient.

RECOMMENDATION:
Screening mammogram in one year. (Code:TA-V-WV9)

BI-RADS CATEGORY  1: Negative.

## 2022-12-24 DIAGNOSIS — M8580 Other specified disorders of bone density and structure, unspecified site: Secondary | ICD-10-CM | POA: Diagnosis not present

## 2022-12-24 DIAGNOSIS — R7303 Prediabetes: Secondary | ICD-10-CM | POA: Diagnosis not present

## 2023-01-12 DIAGNOSIS — N898 Other specified noninflammatory disorders of vagina: Secondary | ICD-10-CM | POA: Diagnosis not present

## 2023-02-18 ENCOUNTER — Other Ambulatory Visit (HOSPITAL_COMMUNITY): Payer: Self-pay

## 2023-02-18 MED ORDER — FLUTICASONE PROPIONATE 50 MCG/ACT NA SUSP
1.0000 | Freq: Every day | NASAL | 2 refills | Status: DC
  Filled 2023-02-18: qty 16, 30d supply, fill #0

## 2023-04-07 ENCOUNTER — Other Ambulatory Visit (HOSPITAL_COMMUNITY): Payer: Self-pay | Admitting: Internal Medicine

## 2023-04-07 DIAGNOSIS — Z1231 Encounter for screening mammogram for malignant neoplasm of breast: Secondary | ICD-10-CM

## 2023-04-27 ENCOUNTER — Ambulatory Visit (HOSPITAL_COMMUNITY)
Admission: RE | Admit: 2023-04-27 | Discharge: 2023-04-27 | Disposition: A | Discharge status: Home / Self Care | Payer: 59 | Source: Ambulatory Visit | Attending: Internal Medicine | Admitting: Internal Medicine

## 2023-04-27 DIAGNOSIS — Z1231 Encounter for screening mammogram for malignant neoplasm of breast: Secondary | ICD-10-CM | POA: Insufficient documentation

## 2023-04-28 ENCOUNTER — Other Ambulatory Visit (HOSPITAL_COMMUNITY): Payer: Self-pay

## 2023-04-28 MED ORDER — HYDROCORTISONE (PERIANAL) 2.5 % EX CREA
1.0000 | TOPICAL_CREAM | Freq: Four times a day (QID) | CUTANEOUS | 1 refills | Status: DC
  Filled 2023-04-28: qty 30, 5d supply, fill #0

## 2023-06-21 DIAGNOSIS — R7303 Prediabetes: Secondary | ICD-10-CM | POA: Diagnosis not present

## 2023-06-21 DIAGNOSIS — M8580 Other specified disorders of bone density and structure, unspecified site: Secondary | ICD-10-CM | POA: Diagnosis not present

## 2023-06-27 ENCOUNTER — Other Ambulatory Visit: Payer: Self-pay

## 2023-06-27 ENCOUNTER — Other Ambulatory Visit (HOSPITAL_COMMUNITY): Payer: Self-pay

## 2023-06-27 MED ORDER — HYDROCORTISONE (PERIANAL) 2.5 % EX CREA
TOPICAL_CREAM | Freq: Two times a day (BID) | CUTANEOUS | 1 refills | Status: AC
  Filled 2023-06-27: qty 30, 5d supply, fill #0

## 2023-06-27 MED ORDER — ESTRADIOL 0.1 MG/GM VA CREA
1.0000 | TOPICAL_CREAM | VAGINAL | 0 refills | Status: AC
  Filled 2023-06-27: qty 42.5, 30d supply, fill #0

## 2023-06-28 ENCOUNTER — Other Ambulatory Visit (HOSPITAL_COMMUNITY): Payer: Self-pay

## 2023-07-26 DIAGNOSIS — H4423 Degenerative myopia, bilateral: Secondary | ICD-10-CM | POA: Diagnosis not present

## 2023-08-03 ENCOUNTER — Telehealth: Admitting: Physician Assistant

## 2023-08-03 ENCOUNTER — Other Ambulatory Visit (HOSPITAL_COMMUNITY): Payer: Self-pay

## 2023-08-03 DIAGNOSIS — H109 Unspecified conjunctivitis: Secondary | ICD-10-CM

## 2023-08-03 MED ORDER — OFLOXACIN 0.3 % OP SOLN
1.0000 [drp] | Freq: Three times a day (TID) | OPHTHALMIC | 0 refills | Status: AC
  Filled 2023-08-03: qty 5, 30d supply, fill #0

## 2023-08-03 NOTE — Progress Notes (Signed)
 Virtual Visit Consent   Diana Taylor, you are scheduled for a virtual visit with a  provider today. Just as with appointments in the office, your consent must be obtained to participate. Your consent will be active for this visit and any virtual visit you may have with one of our providers in the next 365 days. If you have a MyChart account, a copy of this consent can be sent to you electronically.  As this is a virtual visit, video technology does not allow for your provider to perform a traditional examination. This may limit your provider's ability to fully assess your condition. If your provider identifies any concerns that need to be evaluated in person or the need to arrange testing (such as labs, EKG, etc.), we will make arrangements to do so. Although advances in technology are sophisticated, we cannot ensure that it will always work on either your end or our end. If the connection with a video visit is poor, the visit may have to be switched to a telephone visit. With either a video or telephone visit, we are not always able to ensure that we have a secure connection.  By engaging in this virtual visit, you consent to the provision of healthcare and authorize for your insurance to be billed (if applicable) for the services provided during this visit. Depending on your insurance coverage, you may receive a charge related to this service.  I need to obtain your verbal consent now. Are you willing to proceed with your visit today? Diana Taylor has provided verbal consent on 08/03/2023 for a virtual visit (video or telephone). Angelia Kelp, PA-C  Date: 08/03/2023 10:10 AM   Virtual Visit via Video Note   I, Angelia Kelp, connected with  Diana Taylor  ([id number], July 29, 1965) on 08/03/23 at 10:00 AM EDT by a video-enabled telemedicine application and verified that I am speaking with the correct person using two identifiers.  Location: Patient: Virtual Visit  Location Patient: Home Provider: Virtual Visit Location Provider: Home Office   I discussed the limitations of evaluation and management by telemedicine and the availability of in person appointments. The patient expressed understanding and agreed to proceed.    History of Present Illness: Diana Taylor is a 58 y.o. who identifies as a female who was assigned female at birth, and is being seen today for pink eye.  HPI: Conjunctivitis  The current episode started yesterday. The onset was sudden. The problem occurs continuously. The problem has been gradually worsening. The problem is mild. Nothing relieves the symptoms. Nothing aggravates the symptoms. Associated symptoms include eye itching (foreign body sensation) and eye redness. Pertinent negatives include no fever, no decreased vision, no double vision, no photophobia, no eye discharge and no eye pain. The eye pain is mild. The left eye is affected. The eye pain is not associated with movement. The eyelid exhibits no abnormality.     Problems:  Patient Active Problem List   Diagnosis Date Noted   Encounter for screening fecal occult blood testing 01/29/2021   Encounter for gynecological examination with Papanicolaou smear of cervix 01/29/2021   Hematochezia 03/06/2019   Low weight 03/06/2019   Colon polyp 09/02/2017   Bunion 09/02/2017   GERD (gastroesophageal reflux disease)     Allergies:  Allergies  Allergen Reactions   Latex Itching   Other Itching    Hand Sanitizer    Rubbing Alcohol [Alcohol] Itching   Medications:  Current Outpatient Medications:  ofloxacin (OCUFLOX) 0.3 % ophthalmic solution, Place 1 drop into the left eye in the morning, at noon, and at bedtime for 5 days., Disp: 5 mL, Rfl: 0   estradiol  (ESTRACE ) 0.1 MG/GM vaginal cream, Place 1 Applicatorful  twice weekly by vaginal route., Disp: 42.5 g, Rfl: 0   fluticasone  (FLONASE  ALLERGY RELIEF) 50 MCG/ACT nasal spray, Place 1 spray into both nostrils daily.,  Disp: 16 g, Rfl: 2   hydrocortisone  (ANUSOL -HC) 2.5 % rectal cream, Apply a thin layer to the affected area(s) topically 2 to 4 times daily., Disp: 30 g, Rfl: 1   hydrocortisone  (ANUSOL -HC) 2.5 % rectal cream, Apply a thin layer to the affected area(s) by topical route 2-4 times daily., Disp: 30 g, Rfl: 1  Observations/Objective: Patient is well-developed, well-nourished in no acute distress.  Resting comfortably at home.  Head is normocephalic, atraumatic.  No labored breathing.  Speech is clear and coherent with logical content.  Patient is alert and oriented at baseline.    Assessment and Plan: 1. Bacterial conjunctivitis of left eye (Primary) - ofloxacin (OCUFLOX) 0.3 % ophthalmic solution; Place 1 drop into the left eye in the morning, at noon, and at bedtime for 5 days.  Dispense: 5 mL; Refill: 0  - Suspect bacterial conjunctivitis - Ofloxacin prescribed - Warm compresses - Good hand hygiene - Seek in person evaluation if symptoms worsen or fail to improve   Follow Up Instructions: I discussed the assessment and treatment plan with the patient. The patient was provided an opportunity to ask questions and all were answered. The patient agreed with the plan and demonstrated an understanding of the instructions.  A copy of instructions were sent to the patient via MyChart unless otherwise noted below.    The patient was advised to call back or seek an in-person evaluation if the symptoms worsen or if the condition fails to improve as anticipated.    Angelia Kelp, PA-C

## 2023-08-03 NOTE — Patient Instructions (Signed)
  Diana Taylor, thank you for joining Angelia Kelp, PA-C for today's virtual visit.  While this provider is not your primary care provider (PCP), if your PCP is located in our provider database this encounter information will be shared with them immediately following your visit.   A [facility] MyChart account gives you access to today's visit and all your visits, tests, and labs performed at [facility]  click here if you don't have a [facility] MyChart account or go to [email or web address]  Consent: (Patient) Diana Taylor provided verbal consent for this virtual visit at the beginning of the encounter.  Current Medications:  Current Outpatient Medications:    ofloxacin (OCUFLOX) 0.3 % ophthalmic solution, Place 1 drop into the left eye in the morning, at noon, and at bedtime for 5 days., Disp: 5 mL, Rfl: 0   estradiol  (ESTRACE ) 0.1 MG/GM vaginal cream, Place 1 Applicatorful  twice weekly by vaginal route., Disp: 42.5 g, Rfl: 0   fluticasone  (FLONASE  ALLERGY RELIEF) 50 MCG/ACT nasal spray, Place 1 spray into both nostrils daily., Disp: 16 g, Rfl: 2   hydrocortisone  (ANUSOL -HC) 2.5 % rectal cream, Apply a thin layer to the affected area(s) topically 2 to 4 times daily., Disp: 30 g, Rfl: 1   hydrocortisone  (ANUSOL -HC) 2.5 % rectal cream, Apply a thin layer to the affected area(s) by topical route 2-4 times daily., Disp: 30 g, Rfl: 1   Medications ordered in this encounter:  Meds ordered this encounter  Medications   ofloxacin (OCUFLOX) 0.3 % ophthalmic solution    Sig: Place 1 drop into the left eye in the morning, at noon, and at bedtime for 5 days.    Dispense:  5 mL    Refill:  0    Supervising Provider:   Corine Dice [[id number]]     *If you need refills on other medications prior to your next appointment, please contact your pharmacy*  Follow-Up: Call back or seek an in-person evaluation if the symptoms worsen or if the condition fails to improve as  anticipated.  [facility] [phone]  Other Instructions ???????? Bacterial Conjunctivitis, Adult ??????????????????????????????????????????????????????????????????????????????????????????????????????????????? ?????? ??????????????????????????????????? ?????????? ????????????????????????? ???????? ?????????????????????? ????????????? ???????? ???????? ????????????? ???????????????? ??????????????? ????????? ?????????? ?????????????????????????????????????? ?????????? ????? ????? ????? ????? ????? ???????? ?????????????????????????????????????????????????? ???????? ?????????????? ???????????????????????????????? ??????????????????????????????? 10 ????? ???????????????? ??? 2-6 ?????? ????????? ?? ?????????????????????????????????????????????????????? ???????????????????????????? ?????????????????????????????????????????????????????????????? ???? ????????????????????????? ????????????? 3-4 ???? 10-20 ??? ???? ??????????????????????????????????????????????????????????????????????????????????????????????????????? ?????????????????????????????? ???????????? ??????????????????? ????????????? 20 ????????????????????????? ????????? ????????????????????? ???????????????????? ???? ????? 10 ????????? ?????????????? ????????????? ???????????????? ????????????? ?????? ?? ??????????????????????????????????? ??????????????????????????? ????????????? 20 ????????????????????????? ??????????????????????????????????????????? ?????????????? 10 ????????????????????????????????? ???????????????????????????????????????????????? Document Revised: 06/12/2020 Document Reviewed: 06/12/2020 [employer] Patient Education  2024 [employer]   If you have been instructed to have an in-person evaluation today at a local Urgent Care facility, please use the link below. It will take you to a list of all of our available [facility], including  address, phone number and hours of operation. Please do not delay care.  [facility]  If you or a family member do not have a primary care provider, use the link below to schedule a visit and establish care. When you choose a [facility] primary care physician or advanced practice provider, you gain a long-term partner in health. Find a Primary Care Provider  Learn more about [facility]'s in-office and virtual care options:  - Get Care Now

## 2024-01-27 DIAGNOSIS — M8580 Other specified disorders of bone density and structure, unspecified site: Secondary | ICD-10-CM | POA: Diagnosis not present

## 2024-01-27 DIAGNOSIS — R7303 Prediabetes: Secondary | ICD-10-CM | POA: Diagnosis not present

## 2024-04-10 ENCOUNTER — Encounter: Admitting: Obstetrics & Gynecology
